# Patient Record
Sex: Female | Born: 1950
Health system: Southern US, Community
[De-identification: ages and names within clinical notes are randomized; demographics above are authoritative.]

## PROBLEM LIST (undated history)

## (undated) DIAGNOSIS — R42 Dizziness and giddiness: Secondary | ICD-10-CM

## (undated) DIAGNOSIS — F410 Panic disorder [episodic paroxysmal anxiety] without agoraphobia: Secondary | ICD-10-CM

## (undated) DIAGNOSIS — F99 Mental disorder, not otherwise specified: Secondary | ICD-10-CM

## (undated) DIAGNOSIS — M549 Dorsalgia, unspecified: Secondary | ICD-10-CM

## (undated) DIAGNOSIS — M67439 Ganglion, unspecified wrist: Secondary | ICD-10-CM

## (undated) DIAGNOSIS — J449 Chronic obstructive pulmonary disease, unspecified: Secondary | ICD-10-CM

## (undated) HISTORY — PX: ABDOMINAL HYSTERECTOMY: SHX81

## (undated) HISTORY — DX: Chronic obstructive pulmonary disease, unspecified: J44.9

## (undated) HISTORY — DX: Mental disorder, not otherwise specified: F99

## (undated) HISTORY — PX: TONSILLECTOMY: SUR1361

## (undated) HISTORY — PX: CYSTECTOMY: SUR359

---

## 1984-10-20 HISTORY — PX: NASAL SINUS SURGERY: SHX719

## 2001-02-26 ENCOUNTER — Emergency Department (HOSPITAL_COMMUNITY): Admission: EM | Admit: 2001-02-26 | Discharge: 2001-02-26 | Payer: Self-pay | Admitting: Emergency Medicine

## 2001-02-26 ENCOUNTER — Encounter: Payer: Self-pay | Admitting: Emergency Medicine

## 2001-03-02 ENCOUNTER — Encounter: Admission: RE | Admit: 2001-03-02 | Discharge: 2001-03-02 | Payer: Self-pay | Admitting: Family Medicine

## 2001-03-02 ENCOUNTER — Encounter: Payer: Self-pay | Admitting: Family Medicine

## 2001-09-30 ENCOUNTER — Emergency Department (HOSPITAL_COMMUNITY): Admission: EM | Admit: 2001-09-30 | Discharge: 2001-09-30 | Payer: Self-pay | Admitting: Emergency Medicine

## 2001-09-30 ENCOUNTER — Encounter: Payer: Self-pay | Admitting: Emergency Medicine

## 2003-02-18 ENCOUNTER — Emergency Department (HOSPITAL_COMMUNITY): Admission: EM | Admit: 2003-02-18 | Discharge: 2003-02-18 | Payer: Self-pay | Admitting: Emergency Medicine

## 2004-10-30 ENCOUNTER — Ambulatory Visit: Payer: Self-pay | Admitting: Family Medicine

## 2005-05-26 ENCOUNTER — Ambulatory Visit: Payer: Self-pay | Admitting: Family Medicine

## 2005-09-08 ENCOUNTER — Ambulatory Visit: Payer: Self-pay | Admitting: Family Medicine

## 2006-01-02 ENCOUNTER — Ambulatory Visit: Payer: Self-pay | Admitting: Family Medicine

## 2006-07-20 ENCOUNTER — Ambulatory Visit: Payer: Self-pay | Admitting: Family Medicine

## 2007-01-06 ENCOUNTER — Ambulatory Visit: Payer: Self-pay | Admitting: Family Medicine

## 2007-01-21 ENCOUNTER — Encounter: Payer: Self-pay | Admitting: Family Medicine

## 2007-01-21 ENCOUNTER — Ambulatory Visit: Payer: Self-pay | Admitting: Family Medicine

## 2007-01-21 DIAGNOSIS — F411 Generalized anxiety disorder: Secondary | ICD-10-CM | POA: Insufficient documentation

## 2007-01-21 LAB — CONVERTED CEMR LAB
ALT: 17 units/L (ref 0–40)
AST: 16 units/L (ref 0–37)
Albumin: 3.9 g/dL (ref 3.5–5.2)
Alkaline Phosphatase: 102 units/L (ref 39–117)
BUN: 10 mg/dL (ref 6–23)
Basophils Absolute: 0 10*3/uL (ref 0.0–0.1)
Basophils Relative: 0.7 % (ref 0.0–1.0)
Bilirubin, Direct: 0.1 mg/dL (ref 0.0–0.3)
CO2: 32 meq/L (ref 19–32)
Calcium: 9.7 mg/dL (ref 8.4–10.5)
Chloride: 108 meq/L (ref 96–112)
Cholesterol: 296 mg/dL (ref 0–200)
Creatinine, Ser: 0.7 mg/dL (ref 0.4–1.2)
Direct LDL: 152.2 mg/dL
Eosinophils Absolute: 0.2 10*3/uL (ref 0.0–0.6)
Eosinophils Relative: 3.3 % (ref 0.0–5.0)
GFR calc Af Amer: 111 mL/min
GFR calc non Af Amer: 92 mL/min
Glucose, Bld: 85 mg/dL (ref 70–99)
HCT: 39.7 % (ref 36.0–46.0)
HDL: 37.7 mg/dL — ABNORMAL LOW (ref >39.0)
Hemoglobin: 14 g/dL (ref 12.0–15.0)
Lymphocytes Relative: 37.7 % (ref 12.0–46.0)
MCHC: 35.1 g/dL (ref 30.0–36.0)
MCV: 86.1 fL (ref 78.0–100.0)
Monocytes Absolute: 0.5 10*3/uL (ref 0.2–0.7)
Monocytes Relative: 6.6 % (ref 3.0–11.0)
Neutro Abs: 3.7 10*3/uL (ref 1.4–7.7)
Neutrophils Relative %: 51.7 % (ref 43.0–77.0)
Platelets: 258 10*3/uL (ref 150–400)
Potassium: 3.3 meq/L — ABNORMAL LOW (ref 3.5–5.1)
RBC: 4.61 M/uL (ref 3.87–5.11)
RDW: 11.7 % (ref 11.5–14.6)
Sodium: 144 meq/L (ref 135–145)
TSH: 3.19 microintl units/mL (ref 0.35–5.50)
Total Bilirubin: 0.5 mg/dL (ref 0.3–1.2)
Total CHOL/HDL Ratio: 7.9
Total Protein: 7.3 g/dL (ref 6.0–8.3)
Triglycerides: 399 mg/dL (ref 0–149)
VLDL: 80 mg/dL — ABNORMAL HIGH (ref 0–40)
WBC: 7 10*3/uL (ref 4.5–10.5)

## 2007-08-09 ENCOUNTER — Telehealth: Payer: Self-pay | Admitting: Family Medicine

## 2008-01-03 ENCOUNTER — Telehealth: Payer: Self-pay | Admitting: Family Medicine

## 2008-02-07 ENCOUNTER — Telehealth: Payer: Self-pay | Admitting: Family Medicine

## 2010-08-03 ENCOUNTER — Emergency Department (HOSPITAL_COMMUNITY): Admission: EM | Admit: 2010-08-03 | Discharge: 2010-08-03 | Payer: Self-pay | Admitting: Emergency Medicine

## 2011-01-01 LAB — DIFFERENTIAL
Lymphocytes Relative: 15 % (ref 12–46)
Lymphs Abs: 1.6 10*3/uL (ref 0.7–4.0)
Neutrophils Relative %: 81 % — ABNORMAL HIGH (ref 43–77)

## 2011-01-01 LAB — CBC
Platelets: 224 10*3/uL (ref 150–400)
RBC: 4.67 MIL/uL (ref 3.87–5.11)
WBC: 11.4 10*3/uL — ABNORMAL HIGH (ref 4.0–10.5)

## 2011-01-01 LAB — URINALYSIS, ROUTINE W REFLEX MICROSCOPIC
Glucose, UA: NEGATIVE mg/dL
Protein, ur: NEGATIVE mg/dL
Specific Gravity, Urine: <1.005 — ABNORMAL LOW (ref 1.005–1.030)
Urobilinogen, UA: 0.2 mg/dL (ref 0.0–1.0)

## 2011-01-01 LAB — BASIC METABOLIC PANEL
Calcium: 9.9 mg/dL (ref 8.4–10.5)
Chloride: 106 mEq/L (ref 96–112)
Creatinine, Ser: 0.69 mg/dL (ref 0.4–1.2)
GFR calc Af Amer: >60 mL/min (ref >60)
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
## 2011-03-07 NOTE — Assessment & Plan Note (Signed)
[facility] OFFICE NOTE   Jessica [name], Jessica [name]                     MRN:          [id number]  DATE:01/21/2007                            DOB:          1951/01/17    This is a 60 year old woman here for a complete physical examination.  Generally she is doing reasonably well aside from her chronic neck pain  and her mild depression.  We have been treating her for these problems  for years as noted in the chart.  Now she also brings up a problem to me  for the first time of insomnia.  This has been especially difficult over  the past 6 months with both initial insomnia as well as staying asleep  through the night.  Otherwise, it has been many years since she has had  a complete physical or any sort of gynecological examination.  It has  been many years since she has had a mammogram.  She has never had a  colonoscopy.  Of note, she has had a hysterectomy.   For other details of her past medical history, family history, social  history, habits, etc., refer to our introductory note with her dated Mar 02, 2001.   ALLERGIES:  1. LEXAPRO.  2. PENICILLIN.  3. SULFA.  4. EFFEXOR.  5. KEFLEX.  6. TETRACYCLINE.   CURRENT MEDICATIONS:  1. Valium 5 mg b.i.d.  2. Vicodin 10/660 q.i.d.   OBJECTIVE:  Height 5 feet 1 inch.  Weight 152.  BP 122/84.  Pulse 84 and  regular.  GENERAL:  She is a little overweight.  SKIN:  Clear.  Eyes are clear.  She wears glasses.  Ears clear.  Pharynx clear.  NECK:  Supple without lymphadenopathy or masses.  LUNGS:  Clear.  CARDIAC:  Rate and rhythm regular without gallops, murmurs, rubs.  Distal pulses full.  EKG is within normal limits.  ABDOMEN:  Soft.  Normal bowel sounds.  Non-tender.  No masses.  BREASTS AND AXILLAE:  Clear.  PELVIC EXAM:  External genitalia within normal limits.  RECTAL EXAM:  No masses or tenderness.  Stool Hemoccult negative.  EXTREMITIES:  No clubbing,  cyanosis or edema.  NEUROLOGIC EXAM:  Grossly intact.   ASSESSMENT AND PLAN:  Problem:  1. Complete physical.  She is fasting, so we will send her to the lab      for the usual laboratories.  Once again, recommended colonoscopy,      and she once again declined.  She will set up her own mammogram      soon.  2. Chronic neck pain.  Stable.  3. Depression.  Stable.  4. Insomnia.  Ambien 10 mg nightly as needed, number 30 with 5      refills.     Tera Mater. Clent Ridges, MD  Electronically Signed    SAF/MedQ  DD: 01/21/2007  DT: 01/21/2007  Job #: [id number]

## 2013-12-27 ENCOUNTER — Observation Stay (HOSPITAL_COMMUNITY)
Admission: EM | Admit: 2013-12-27 | Discharge: 2013-12-28 | Disposition: A | Discharge status: Home / Self Care | Payer: Self-pay | Attending: Internal Medicine | Admitting: Internal Medicine

## 2013-12-27 ENCOUNTER — Encounter (HOSPITAL_COMMUNITY): Payer: Self-pay | Admitting: Emergency Medicine

## 2013-12-27 ENCOUNTER — Emergency Department (HOSPITAL_COMMUNITY): Payer: Self-pay

## 2013-12-27 DIAGNOSIS — R079 Chest pain, unspecified: Secondary | ICD-10-CM

## 2013-12-27 DIAGNOSIS — Z8659 Personal history of other mental and behavioral disorders: Secondary | ICD-10-CM | POA: Insufficient documentation

## 2013-12-27 DIAGNOSIS — R071 Chest pain on breathing: Principal | ICD-10-CM | POA: Insufficient documentation

## 2013-12-27 DIAGNOSIS — F411 Generalized anxiety disorder: Secondary | ICD-10-CM

## 2013-12-27 DIAGNOSIS — Z79899 Other long term (current) drug therapy: Secondary | ICD-10-CM | POA: Insufficient documentation

## 2013-12-27 DIAGNOSIS — Z881 Allergy status to other antibiotic agents status: Secondary | ICD-10-CM | POA: Insufficient documentation

## 2013-12-27 DIAGNOSIS — Z882 Allergy status to sulfonamides status: Secondary | ICD-10-CM | POA: Insufficient documentation

## 2013-12-27 DIAGNOSIS — F17200 Nicotine dependence, unspecified, uncomplicated: Secondary | ICD-10-CM | POA: Insufficient documentation

## 2013-12-27 DIAGNOSIS — R0602 Shortness of breath: Secondary | ICD-10-CM | POA: Insufficient documentation

## 2013-12-27 DIAGNOSIS — Z880 Allergy status to penicillin: Secondary | ICD-10-CM | POA: Insufficient documentation

## 2013-12-27 HISTORY — DX: Dorsalgia, unspecified: M54.9

## 2013-12-27 HISTORY — DX: Panic disorder (episodic paroxysmal anxiety): F41.0

## 2013-12-27 LAB — CBC
HCT: 42.9 % (ref 36.0–46.0)
HEMOGLOBIN: 15 g/dL (ref 12.0–15.0)
MCH: 30.1 pg (ref 26.0–34.0)
MCHC: 35 g/dL (ref 30.0–36.0)
MCV: 86.1 fL (ref 78.0–100.0)
Platelets: 256 10*3/uL (ref 150–400)
RBC: 4.98 MIL/uL (ref 3.87–5.11)
RDW: 12.5 % (ref 11.5–15.5)
WBC: 8.4 10*3/uL (ref 4.0–10.5)

## 2013-12-27 LAB — BASIC METABOLIC PANEL
BUN: 9 mg/dL (ref 6–23)
CALCIUM: 9.8 mg/dL (ref 8.4–10.5)
CO2: 25 mEq/L (ref 19–32)
Chloride: 101 mEq/L (ref 96–112)
Creatinine, Ser: 0.68 mg/dL (ref 0.50–1.10)
GLUCOSE: 97 mg/dL (ref 70–99)
POTASSIUM: 4.2 meq/L (ref 3.7–5.3)
SODIUM: 141 meq/L (ref 137–147)

## 2013-12-27 LAB — I-STAT TROPONIN, ED: Troponin i, poc: 0 ng/mL (ref 0.00–0.08)

## 2013-12-27 MED ORDER — DIAZEPAM 5 MG PO TABS
5.0000 mg | ORAL_TABLET | Freq: Four times a day (QID) | ORAL | Status: DC | PRN
  Administered 2013-12-27: 5 mg via ORAL
  Filled 2013-12-27: qty 1

## 2013-12-27 MED ORDER — HYDROCODONE-ACETAMINOPHEN 5-325 MG PO TABS
2.0000 | ORAL_TABLET | Freq: Once | ORAL | Status: AC
  Administered 2013-12-27: 2 via ORAL
  Filled 2013-12-27: qty 2

## 2013-12-27 MED ORDER — HYDROCODONE-ACETAMINOPHEN 10-325 MG PO TABS
1.0000 | ORAL_TABLET | Freq: Four times a day (QID) | ORAL | Status: DC | PRN
  Administered 2013-12-28 (×2): 1 via ORAL
  Filled 2013-12-27 (×2): qty 1

## 2013-12-27 MED ORDER — NITROGLYCERIN 0.4 MG SL SUBL: 0.4000 mg | SUBLINGUAL_TABLET | SUBLINGUAL | Status: DC | PRN

## 2013-12-27 MED ORDER — HEPARIN SODIUM (PORCINE) 5000 UNIT/ML IJ SOLN
5000.0000 [IU] | Freq: Three times a day (TID) | INTRAMUSCULAR | Status: DC
  Administered 2013-12-27 – 2013-12-28 (×2): 5000 [IU] via SUBCUTANEOUS
  Filled 2013-12-27 (×5): qty 1

## 2013-12-27 MED ORDER — SODIUM CHLORIDE 0.9 % IV SOLN: INTRAVENOUS | Status: DC

## 2013-12-27 MED ORDER — ASPIRIN EC 325 MG PO TBEC
325.0000 mg | DELAYED_RELEASE_TABLET | Freq: Every day | ORAL | Status: DC
  Administered 2013-12-28: 325 mg via ORAL
  Filled 2013-12-27: qty 1

## 2013-12-27 MED ORDER — SODIUM CHLORIDE 0.9 % IJ SOLN
3.0000 mL | Freq: Two times a day (BID) | INTRAMUSCULAR | Status: DC
  Administered 2013-12-28 (×2): 3 mL via INTRAVENOUS

## 2013-12-27 NOTE — ED Provider Notes (Signed)
Medical screening examination/treatment/procedure(s) were conducted as a shared visit with non-physician practitioner(s) and myself.  I personally evaluated the patient during the encounter.   EKG Interpretation   Date/Time:  Tuesday December 27 2013 20:40:21 EDT Ventricular Rate:  67 PR Interval:  155 QRS Duration: 96 QT Interval:  392 QTC Calculation: 414 R Axis:   37 Text Interpretation:  Age not entered, assumed to be  63 years old for  purpose of ECG interpretation Sinus rhythm No significant change since  last tracing EARLIER SAME DATE Confirmed by Bernette MayersSHELDON  MD, CHARLES [phone]([id number]  on 12/27/2013 8:49:20 PM      Pt with CP off and on for several weeks constant since earlier today. Risk factors of smoking. Questionable outpatient followup. Will plan admission for rule out.   Charles B. Bernette MayersSheldon, MD 12/27/13 2318

## 2013-12-27 NOTE — ED Provider Notes (Signed)
CSN: [id number]     Arrival date & time 12/27/13  1553 History   First MD Initiated Contact with Patient 12/27/13 1945     Chief Complaint  Patient presents with  . Chest Pain     (Consider location/radiation/quality/duration/timing/severity/associated sxs/prior Treatment) HPI Comments: Patient is a 63 year old female with a history of tobacco use, patient smokes 1.5-2 packs of cigarettes per day, who presents to the emergency department for chest pain. Patient states that chest pain has been intermittent over the last 2 weeks. She states that her chest pain has been constant since its morning. Pain is waxing and waning in severity and sharp in nature. Pain is currently rated 3/10, but was rated 8/10 at its worst today. Patient took an aspirin for her symptoms without relief. She denies any modifying factors of her symptoms. Patient denies associated fever, syncope or near syncope, cough or hemoptysis, nausea or vomiting, abdominal pain, numbness/tingling, and extremity weakness. Patient denies history of CAD, ACS, hypertension, hyperlipidemia, PE/DVT, and coagulopathies. Patient denies a family history of ACS. She says she has been under an increased amount of stress lately.  Patient is a 63 y.o. female presenting with chest pain. The history is provided by the patient. No language interpreter was used.  Chest Pain Associated symptoms: shortness of breath   Associated symptoms: no abdominal pain, no diaphoresis, no nausea, no numbness, not vomiting and no weakness     Past Medical History  Diagnosis Date  . Panic attack   . Back pain    Past Surgical History  Procedure Laterality Date  . Abdominal hysterectomy      partial  . Cystectomy     History reviewed. No pertinent family history. History  Substance Use Topics  . Smoking status: Current Every Day Smoker  . Smokeless tobacco: Not on file  . Alcohol Use: No   OB History   Grav Para Term Preterm Abortions TAB SAB Ect Mult  Living                  Review of Systems  Constitutional: Negative for chills and diaphoresis.  Respiratory: Positive for shortness of breath.   Cardiovascular: Positive for chest pain.  Gastrointestinal: Negative for nausea, vomiting and abdominal pain.  Neurological: Negative for syncope, weakness and numbness.  All other systems reviewed and are negative.     Allergies  Penicillins and Sulfa antibiotics  Home Medications   Current Outpatient Rx  Name  Route  Sig  Dispense  Refill  . diazepam (VALIUM) 5 MG tablet   Oral   Take 5 mg by mouth every 6 (six) hours as needed for anxiety.         Marland Kitchen HYDROcodone-acetaminophen (NORCO) 10-325 MG per tablet   Oral   Take 1 tablet by mouth every 6 (six) hours as needed.         Marland Kitchen ibuprofen (ADVIL,MOTRIN) 200 MG tablet   Oral   Take 200 mg by mouth every 6 (six) hours as needed.         . pseudoephedrine (SUDAFED) 30 MG tablet   Oral   Take 30 mg by mouth every 4 (four) hours as needed for congestion.          BP 105/66  Pulse 71  Temp(Src) 98.5 F (36.9 C) (Oral)  Resp 15  SpO2 96%  Physical Exam  Nursing note and vitals reviewed. Constitutional: She is oriented to person, place, and time. She appears well-developed and well-nourished. No distress.  HENT:  Head: Normocephalic and atraumatic.  Mouth/Throat: Oropharynx is clear and moist. No oropharyngeal exudate.  Eyes: Conjunctivae and EOM are normal. Pupils are equal, round, and reactive to light. No scleral icterus.  Neck: Normal range of motion.  Cardiovascular: Normal rate, regular rhythm, normal heart sounds and intact distal pulses.   No carotid bruits appreciated bilaterally  Pulmonary/Chest: Effort normal and breath sounds normal. No respiratory distress. She has no wheezes. She has no rales. She exhibits tenderness (Tenderness to palpation of the anterior left chest wall).  Chest expansion symmetric. No tachypnea, retractions, or accessory muscle use.   Abdominal: Soft. She exhibits no distension. There is no tenderness. There is no rebound and no guarding.  Musculoskeletal: Normal range of motion.  Neurological: She is alert and oriented to person, place, and time.  Skin: Skin is warm and dry. No rash noted. She is not diaphoretic. No erythema. No pallor.  Psychiatric: She has a normal mood and affect. Her behavior is normal.    ED Course  Procedures (including critical care time) Labs Review Labs Reviewed  CBC  BASIC METABOLIC PANEL  Rosezena SensorI-STAT TROPOININ, ED   Imaging Review Dg Chest 2 View  12/27/2013   CLINICAL DATA Chest pain and dizzy.  EXAM CHEST  2 VIEW  COMPARISON 08/03/2010  FINDINGS Chronic lucency in the upper lungs consistent with emphysema. Stable scarring in the left mid lung. Heart size is normal. No evidence for airspace disease. Bony thorax is intact.  IMPRESSION Chronic lung disease without acute findings.  SIGNATURE  Electronically Signed   By: Richarda OverlieAdam  Henn M.D.   On: 12/27/2013 17:37     EKG Interpretation   Date/Time:  Tuesday December 27 2013 20:40:21 EDT Ventricular Rate:  67 PR Interval:  155 QRS Duration: 96 QT Interval:  392 QTC Calculation: 414 R Axis:   37 Text Interpretation:  Age not entered, assumed to be  63 years old for  purpose of ECG interpretation Sinus rhythm No significant change since  last tracing EARLIER SAME DATE Confirmed by Bernette MayersSHELDON  MD, Leonette MostHARLES [phone]([id number])  on 12/27/2013 8:49:20 PM      MDM   Final diagnoses:  Chest pain    63 year old female with a history of tobacco use presents to the emergency department for chest pain. Chest pain has been ongoing over the last 2 weeks. Patient endorses it becoming more frequent recently. She specifically notes chest pain being constant since this morning. It has improved spontaneously since onset.  Physical exam today significant for chest pain reproducible on palpation of the left mid chest. Her cardiac workup today is negative with a negative  troponin and stable EKG. Chest x-ray shows no evidence of pneumothorax, pleural effusion, pneumonia, or mediastinal widening. Labs are unremarkable. My suspicion for ACS this patient is low given her reassuring workup. Patient also has a heart score of 3 consistent with low risk of MACE. Still, story is concerning given increasing frequency of chest pain and the fact that her pain has been lasting longer. Believe she warrants ACS rule out for these reasons. Have consulted with [facility] who will admit.   Filed Vitals:   12/27/13 1559 12/27/13 2028 12/27/13 2100  BP: 122/75 129/67 105/66  Pulse: 73 67 71  Temp: 98.5 F (36.9 C)    TempSrc: Oral    Resp: 18 19 15   SpO2: 97% 99% 96%       Antony MaduraKelly Aliyana Dlugosz, PA-C 12/27/13 2305

## 2013-12-27 NOTE — ED Notes (Addendum)
Pt presents with Left side chest pain x2 months, persistent Left side chest pain, SOB, and dizziness. Pt denies nausea, vomiting, diarrhea. Pt states she took ASA 325 mg

## 2013-12-27 NOTE — H&P (Addendum)
Hospitalist Admission History and Physical  Patient name: Jessica [name] Medical record number: [id number] Date of birth: 10/15/1951 Age: 63 y.o. Gender: female  Primary Care Provider: Pershing ProudJACKSON,SAMANTHA, [name]  Chief Complaint: chest pain  History of Present Illness:This is a 63 y.o. year old female with prior history of chronic back pain, tobacco abuse, presenting with chest pain x2 months. Patient reports intermittent episodes of anterior chest wall pain over the past 2 months. Patient states she shown clean up yesterday when she noticed some anterior chest wall pain after sweeping. Chest pain is described as anterior with 3 separate sharp pains do not radiate. No associated nausea, diaphoresis. Faint intermittent dizziness per patient. Patient states the pain persisted throughout last night. The pain did not wake her up from sleep. Patient states she took a full dose aspirin, Valium, Vicodin with minimal small improvement in pain. Denies any worse pain with deep breathing or cough. Pain does seem to be worse with movement. Patient presented to the ER today with chest pain. Troponins negative x1. EKG within normal limits. Labs otherwise within normal limits. Hemodynamically stable. Chest x-ray showed chronic lung disease with no acute findings.  Cardiovascular risk factors: Tobacco abuse, positive family history, age  Patient Active Problem List   Diagnosis Date Noted  . Chest pain 12/27/2013  . ANXIETY 01/21/2007   Past Medical History: Past Medical History  Diagnosis Date  . Panic attack   . Back pain     Past Surgical History: Past Surgical History  Procedure Laterality Date  . Abdominal hysterectomy      partial  . Cystectomy      Social History: History   Social History  . Marital Status: Single    Spouse Name: N/A    Number of Children: N/A  . Years of Education: N/A   Social History Main Topics  . Smoking status: Current Every Day Smoker  . Smokeless tobacco: None   . Alcohol Use: No  . Drug Use: No  . Sexual Activity: None   Other Topics Concern  . None   Social History Narrative  . None    Family History: History reviewed. No pertinent family history.  Allergies: Allergies  Allergen Reactions  . Penicillins   . Sulfa Antibiotics     Current Facility-Administered Medications  Medication Dose Route Frequency Provider Last Rate Last Dose  . 0.9 %  sodium chloride infusion   Intravenous Continuous Jessica [name]      . Melene Muller[START ON 12/28/2013] aspirin EC tablet 325 mg  325 mg Oral Daily Jessica [name]      . diazepam (VALIUM) tablet 5 mg  5 mg Oral Q6H PRN Jessica [name]      . heparin injection 5,000 Units  5,000 Units Subcutaneous 3 times per day Jessica [name]      . HYDROcodone-acetaminophen [facility](NORCO) 10-325 MG per tablet 1 tablet  1 tablet Oral Q6H PRN Jessica [name]      . sodium chloride 0.9 % injection 3 mL  3 mL Intravenous Q12H Jessica [name]       Current Outpatient Prescriptions  Medication Sig Dispense Refill  . diazepam (VALIUM) 5 MG tablet Take 5 mg by mouth every 6 (six) hours as needed for anxiety.      Marland Kitchen. HYDROcodone-acetaminophen (NORCO) 10-325 MG per tablet Take 1 tablet by mouth every 6 (six) hours as needed.      Marland Kitchen. ibuprofen (ADVIL,MOTRIN) 200 MG tablet Take 200 mg by mouth every  6 (six) hours as needed.      . pseudoephedrine (SUDAFED) 30 MG tablet Take 30 mg by mouth every 4 (four) hours as needed for congestion.       Review Of Systems: 12 point ROS negative except as noted above in HPI.  Physical Exam: Filed Vitals:   12/27/13 2100  BP: 105/66  Pulse: 71  Temp:   Resp: 15    General: alert and cooperative HEENT: PERRLA and extra ocular movement intact Heart: S1, S2 normal, no murmur, rub or gallop, regular rate and rhythm, positive anterior chest wall tenderness palpation mainly over left anterior chest. Positive pain over left anterior chest with resisted external rotation of the shoulders  bilaterally. Lungs: clear to auscultation and unlabored breathing Abdomen: abdomen is soft without significant tenderness, masses, organomegaly or guarding Extremities: extremities normal, atraumatic, no cyanosis or edema Skin:no rashes, no ecchymoses Neurology: normal without focal findings  Labs and Imaging: Lab Results  Component Value Date/Time   NA 141 12/27/2013  4:11 PM   K 4.2 12/27/2013  4:11 PM   CL 101 12/27/2013  4:11 PM   CO2 25 12/27/2013  4:11 PM   BUN 9 12/27/2013  4:11 PM   CREATININE 0.68 12/27/2013  4:11 PM   GLUCOSE 97 12/27/2013  4:11 PM   Lab Results  Component Value Date   WBC 8.4 12/27/2013   HGB 15.0 12/27/2013   HCT 42.9 12/27/2013   MCV 86.1 12/27/2013   PLT 256 12/27/2013     Dg Chest 2 View  12/27/2013   CLINICAL DATA Chest pain and dizzy.  EXAM CHEST  2 VIEW  COMPARISON 08/03/2010  FINDINGS Chronic lucency in the upper lungs consistent with emphysema. Stable scarring in the left mid lung. Heart size is normal. No evidence for airspace disease. Bony thorax is intact.  IMPRESSION Chronic lung disease without acute findings.  SIGNATURE  Electronically Signed   By: Jessica Overlie M.D.   On: 12/27/2013 17:37     Assessment and Plan: Jessica [name] is a 63 y.o. year old female presenting with chest pain  Chest pain: Highly atypical symptoms. Reproducible more to tenderness palpation across anterior chest. Suspect musculoskeletal source versus cardiac etiology which is less likely. Positive cardiovascular risk factors including age, tobacco abuse, family history. No tachycardia or hypoxia today. Wells score 0. We'll cycle cardiac enzymes. Risk stratification labs including hemoglobin A1c, TSH, lipid panel. May benefit from inpatient vs. outpt  stress test.? Cards consult in a.m. Defer to a.m. Team. Full dose aspirin. When necessary nitroglycerin. Tobacco abuse counseling.   Anxiety: Continue Valium Chronic back pain: Continue Vicodin. Should also help with  costochondritic component.  FEN/GI: heart healthy diet  Prophylaxis: sub q heparin  Disposition: pending further evaluation  Code Status:full code        Jessica Albee [name]  Pager: [phone]

## 2013-12-28 ENCOUNTER — Encounter (HOSPITAL_COMMUNITY): Payer: Self-pay

## 2013-12-28 DIAGNOSIS — R079 Chest pain, unspecified: Secondary | ICD-10-CM

## 2013-12-28 DIAGNOSIS — F411 Generalized anxiety disorder: Secondary | ICD-10-CM

## 2013-12-28 DIAGNOSIS — F17200 Nicotine dependence, unspecified, uncomplicated: Secondary | ICD-10-CM

## 2013-12-28 LAB — CBC WITH DIFFERENTIAL/PLATELET
BASOS ABS: 0.1 10*3/uL (ref 0.0–0.1)
Basophils Relative: 1 % (ref 0–1)
Eosinophils Absolute: 0.1 10*3/uL (ref 0.0–0.7)
Eosinophils Relative: 2 % (ref 0–5)
HCT: 37.8 % (ref 36.0–46.0)
Hemoglobin: 13.2 g/dL (ref 12.0–15.0)
LYMPHS ABS: 2.6 10*3/uL (ref 0.7–4.0)
LYMPHS PCT: 35 % (ref 12–46)
MCH: 30.3 pg (ref 26.0–34.0)
MCHC: 34.9 g/dL (ref 30.0–36.0)
MCV: 86.7 fL (ref 78.0–100.0)
Monocytes Absolute: 0.6 10*3/uL (ref 0.1–1.0)
Monocytes Relative: 7 % (ref 3–12)
NEUTROS PCT: 55 % (ref 43–77)
Neutro Abs: 4.1 10*3/uL (ref 1.7–7.7)
Platelets: 228 10*3/uL (ref 150–400)
RBC: 4.36 MIL/uL (ref 3.87–5.11)
RDW: 12.6 % (ref 11.5–15.5)
WBC: 7.4 10*3/uL (ref 4.0–10.5)

## 2013-12-28 LAB — COMPREHENSIVE METABOLIC PANEL
ALBUMIN: 3.4 g/dL — AB (ref 3.5–5.2)
ALK PHOS: 81 U/L (ref 39–117)
ALT: 7 U/L (ref 0–35)
AST: 13 U/L (ref 0–37)
BUN: 10 mg/dL (ref 6–23)
CHLORIDE: 104 meq/L (ref 96–112)
CO2: 26 mEq/L (ref 19–32)
CREATININE: 0.78 mg/dL (ref 0.50–1.10)
Calcium: 9.5 mg/dL (ref 8.4–10.5)
GFR calc non Af Amer: 87 mL/min — ABNORMAL LOW (ref >90)
GLUCOSE: 105 mg/dL — AB (ref 70–99)
POTASSIUM: 3.6 meq/L — AB (ref 3.7–5.3)
Sodium: 142 mEq/L (ref 137–147)
Total Bilirubin: 0.3 mg/dL (ref 0.3–1.2)
Total Protein: 6.3 g/dL (ref 6.0–8.3)

## 2013-12-28 LAB — LIPID PANEL
Cholesterol: 250 mg/dL — ABNORMAL HIGH (ref 0–200)
HDL: 36 mg/dL — AB (ref >39)
LDL Cholesterol: 172 mg/dL — ABNORMAL HIGH (ref 0–99)
Total CHOL/HDL Ratio: 6.9 RATIO
Triglycerides: 211 mg/dL — ABNORMAL HIGH (ref <150)
VLDL: 42 mg/dL — ABNORMAL HIGH (ref 0–40)

## 2013-12-28 LAB — TSH: TSH: 1.78 u[IU]/mL (ref 0.350–4.500)

## 2013-12-28 LAB — HEMOGLOBIN A1C
HEMOGLOBIN A1C: 5.5 % (ref <5.7)
MEAN PLASMA GLUCOSE: 111 mg/dL (ref <117)

## 2013-12-28 LAB — TROPONIN I

## 2013-12-28 NOTE — Progress Notes (Deleted)
Physician Discharge Summary  Jessica [name] Real WGN:[id number] DOB: 02/07/1951 DOA: 12/27/2013  PCP: Jessica ProudJACKSON,Jessica [name]  Admit date: 12/27/2013 Discharge date: 12/28/2013  Time spent: Less than 30 minutes  Recommendations for Outpatient Follow-up:  1. Jessica PurserSamantha Jackson, [name], in 3 days. May consider OP Cardiology consultation.  Discharge Diagnoses:  Active Problems:   Chest pain   Discharge Condition: Improved & Stable  Diet recommendation: Heart Healthy diet.  Filed Weights   12/28/13 0007  Weight: 60.464 kg (133 lb 4.8 oz)    History of present illness & hospital course:  63 year old female patient with history of chronic back pain, ongoing tobacco abuse, anxiety, presented to the ED on 12/27/13 with worsening chest pain. She reports intermittent episodes of anterior chest wall pain for the last 2 months. This seemed to get worse yesterday afternoon-located left upper parasternal region, describes it as sharp, worse on touching or with chest wall movements, nonpleuritic, nonradiating, not associated with dyspnea or cough, improved after pain medications. She denies trauma or lifting anything unusual. EKG without acute changes. Chest x-ray without acute changes. She was admitted to telemetry which did not show any arrhythmia alarms. Troponin was cycled x2 are negative. Physical exam shows chest pain reproducible on palpation of left upper parasternal region. This is highly suspicious for musculoskeletal etiology-possible costochondritis and less likely cardiac in origin. She is advised to continue taking her NSAIDs. She has been counseled extensively regarding tobacco cessation. Will defer management of dyslipidemia to outpatient followup. May consider outpatient cardiology consultation if further evaluation is deemed necessary.  Consultations:  None  Procedures:  None    Discharge Exam:  Complaints:  Chest pain is better but still reproducible to palpation.  Filed Vitals:    12/27/13 2300 12/28/13 0007 12/28/13 0609 12/28/13 1419  BP: 131/74 107/65 95/57 124/67  Pulse: 80 74 83 67  Temp:  97.9 F (36.6 C) 98.7 F (37.1 C) 97.2 F (36.2 C)  TempSrc:  Oral Oral Oral  Resp: 15     Height:  5\' 1"  (1.549 m)    Weight:  60.464 kg (133 lb 4.8 oz)    SpO2: 100% 100% 96% 96%    General exam: Middle-aged female lying comfortably in bed. Respiratory system: Clear. No increased work of breathing. Cardiovascular system: S1 & S2 heard, RRR. No JVD, murmurs, gallops, clicks or pedal edema. Telemetry: Sinus rhythm sinus bradycardia in the 50s. Gastrointestinal system: Abdomen is nondistended, soft and nontender. Normal bowel sounds heard. Central nervous system: Alert and oriented. No focal neurological deficits. Extremities: Symmetric 5 x 5 power.  Discharge Instructions      Discharge Orders   Future Orders Complete By Expires   Call MD for:  severe uncontrolled pain  As directed    Diet - low sodium heart healthy  As directed    Increase activity slowly  As directed        Medication List         diazepam 5 MG tablet  Commonly known as:  VALIUM  Take 5 mg by mouth every 6 (six) hours as needed for anxiety.     HYDROcodone-acetaminophen 10-325 MG per tablet  Commonly known as:  NORCO  Take 1 tablet by mouth every 6 (six) hours as needed.     ibuprofen 200 MG tablet  Commonly known as:  ADVIL,MOTRIN  Take 200 mg by mouth every 6 (six) hours as needed.     pseudoephedrine 30 MG tablet  Commonly known as:  SUDAFED  Take 30  mg by mouth every 4 (four) hours as needed for congestion.       Follow-up Information   Follow up with Jessica Purser, [name]. Schedule an appointment as soon as possible for a visit in 3 days.   Specialty:  Family Medicine   Contact information:   [address] [place] [address] [phone]        The results of significant diagnostics from this hospitalization (including imaging, microbiology, ancillary  and laboratory) are listed below for reference.    Significant Diagnostic Studies: Dg Chest 2 View  12/27/2013   CLINICAL DATA Chest pain and dizzy.  EXAM CHEST  2 VIEW  COMPARISON 08/03/2010  FINDINGS Chronic lucency in the upper lungs consistent with emphysema. Stable scarring in the left mid lung. Heart size is normal. No evidence for airspace disease. Bony thorax is intact.  IMPRESSION Chronic lung disease without acute findings.  SIGNATURE  Electronically Signed   By: Richarda Overlie M.D.   On: 12/27/2013 17:37    Microbiology: No results found for this or any previous visit (from the past 240 hour([name])).   Labs: Basic Metabolic Panel:  Recent Labs Lab 12/27/13 1611 12/28/13 0144  NA 141 142  K 4.2 3.6*  CL 101 104  CO2 25 26  GLUCOSE 97 105*  BUN 9 10  CREATININE 0.68 0.78  CALCIUM 9.8 9.5   Liver Function Tests:  Recent Labs Lab 12/28/13 0144  AST 13  ALT 7  ALKPHOS 81  BILITOT 0.3  PROT 6.3  ALBUMIN 3.4*   No results found for this basename: LIPASE, AMYLASE,  in the last 168 hours No results found for this basename: AMMONIA,  in the last 168 hours CBC:  Recent Labs Lab 12/27/13 1611 12/28/13 0144  WBC 8.4 7.4  NEUTROABS  --  4.1  HGB 15.0 13.2  HCT 42.9 37.8  MCV 86.1 86.7  PLT 256 228   Cardiac Enzymes:  Recent Labs Lab 12/28/13 0144 12/28/13 0725  TROPONINI <0.30 <0.30   BNP: BNP (last 3 results) No results found for this basename: PROBNP,  in the last 8760 hours CBG: No results found for this basename: GLUCAP,  in the last 168 hours  Additional labs: 1. Fasting lipids: Cholesterol 250, triglycerides 211, HDL 36, LDL 172 VLDL 42  2. Hemoglobin A1c: 5.5  3. TSH: 1.780    Signed:  Marcellus Scott, MD, FACP, FHM. [facility] Pager [phone]  If 7PM-7AM, please contact night-coverage [email or web address] Password [email or web address] 12/28/2013, 5:25 PM

## 2013-12-28 NOTE — Progress Notes (Signed)
UR completed 

## 2013-12-28 NOTE — Progress Notes (Signed)
DC orders received.  Patient stable with no S/S of distress.  Medication and discharge information reviewed with patient.  Patient DC home. Jessica [name]  

## 2013-12-28 NOTE — Discharge Summary (Signed)
Physician Discharge Summary  Drexel IhaFrances S Mckown GNF:[id number] DOB: 08/04/1951 DOA: 12/27/2013  PCP: Pershing ProudJACKSON,SAMANTHA, PA-C  Admit date: 12/27/2013 Discharge date: 12/28/2013  Time spent: Less than 30 minutes  Recommendations for Outpatient Follow-up:  1. Terie PurserSamantha Jackson, PA-C, in 3 days. May consider OP Cardiology consultation.  Discharge Diagnoses:  Active Problems:   Chest pain   Discharge Condition: Improved & Stable  Diet recommendation: Heart Healthy diet.  Filed Weights   12/28/13 0007  Weight: 60.464 kg (133 lb 4.8 oz)    History of present illness & hospital course:  63 year old female patient with history of chronic back pain, ongoing tobacco abuse, anxiety, presented to the ED on 12/27/13 with worsening chest pain. She reports intermittent episodes of anterior chest wall pain for the last 2 months. This seemed to get worse yesterday afternoon-located left upper parasternal region, describes it as sharp, worse on touching or with chest wall movements, nonpleuritic, nonradiating, not associated with dyspnea or cough, improved after pain medications. She denies trauma or lifting anything unusual. EKG without acute changes. Chest x-ray without acute changes. She was admitted to telemetry which did not show any arrhythmia alarms. Troponin was cycled x2 are negative. Physical exam shows chest pain reproducible on palpation of left upper parasternal region. This is highly suspicious for musculoskeletal etiology-possible costochondritis and less likely cardiac in origin. She is advised to continue taking her NSAIDs. She has been counseled extensively regarding tobacco cessation. Will defer management of dyslipidemia to outpatient followup. May consider outpatient cardiology consultation if further evaluation is deemed necessary.  Consultations:  None  Procedures:  None    Discharge Exam:  Complaints:  Chest pain is better but still reproducible to palpation.  Filed Vitals:    12/27/13 2300 12/28/13 0007 12/28/13 0609 12/28/13 1419  BP: 131/74 107/65 95/57 124/67  Pulse: 80 74 83 67  Temp:  97.9 F (36.6 C) 98.7 F (37.1 C) 97.2 F (36.2 C)  TempSrc:  Oral Oral Oral  Resp: 15     Height:  5\' 1"  (1.549 m)    Weight:  60.464 kg (133 lb 4.8 oz)    SpO2: 100% 100% 96% 96%    General exam: Middle-aged female lying comfortably in bed. Respiratory system: Clear. No increased work of breathing. Cardiovascular system: S1 & S2 heard, RRR. No JVD, murmurs, gallops, clicks or pedal edema. Telemetry: Sinus rhythm sinus bradycardia in the 50s. Gastrointestinal system: Abdomen is nondistended, soft and nontender. Normal bowel sounds heard. Central nervous system: Alert and oriented. No focal neurological deficits. Extremities: Symmetric 5 x 5 power.  Discharge Instructions      Discharge Orders   Future Orders Complete By Expires   Call MD for:  severe uncontrolled pain  As directed    Diet - low sodium heart healthy  As directed    Increase activity slowly  As directed        Medication List         diazepam 5 MG tablet  Commonly known as:  VALIUM  Take 5 mg by mouth every 6 (six) hours as needed for anxiety.     HYDROcodone-acetaminophen 10-325 MG per tablet  Commonly known as:  NORCO  Take 1 tablet by mouth every 6 (six) hours as needed.     ibuprofen 200 MG tablet  Commonly known as:  ADVIL,MOTRIN  Take 200 mg by mouth every 6 (six) hours as needed.     pseudoephedrine 30 MG tablet  Commonly known as:  SUDAFED  Take 30  mg by mouth every 4 (four) hours as needed for congestion.       Follow-up Information   Follow up with Terie Purser, PA-C. Schedule an appointment as soon as possible for a visit in 3 days.   Specialty:  Family Medicine   Contact information:   [address] [place] [address] [phone]        The results of significant diagnostics from this hospitalization (including imaging, microbiology, ancillary  and laboratory) are listed below for reference.    Significant Diagnostic Studies: Dg Chest 2 View  12/27/2013   CLINICAL DATA Chest pain and dizzy.  EXAM CHEST  2 VIEW  COMPARISON 08/03/2010  FINDINGS Chronic lucency in the upper lungs consistent with emphysema. Stable scarring in the left mid lung. Heart size is normal. No evidence for airspace disease. Bony thorax is intact.  IMPRESSION Chronic lung disease without acute findings.  SIGNATURE  Electronically Signed   By: Richarda Overlie M.D.   On: 12/27/2013 17:37    Microbiology: No results found for this or any previous visit (from the past 240 hour(s)).   Labs: Basic Metabolic Panel:  Recent Labs Lab 12/27/13 1611 12/28/13 0144  NA 141 142  K 4.2 3.6*  CL 101 104  CO2 25 26  GLUCOSE 97 105*  BUN 9 10  CREATININE 0.68 0.78  CALCIUM 9.8 9.5   Liver Function Tests:  Recent Labs Lab 12/28/13 0144  AST 13  ALT 7  ALKPHOS 81  BILITOT 0.3  PROT 6.3  ALBUMIN 3.4*   No results found for this basename: LIPASE, AMYLASE,  in the last 168 hours No results found for this basename: AMMONIA,  in the last 168 hours CBC:  Recent Labs Lab 12/27/13 1611 12/28/13 0144  WBC 8.4 7.4  NEUTROABS  --  4.1  HGB 15.0 13.2  HCT 42.9 37.8  MCV 86.1 86.7  PLT 256 228   Cardiac Enzymes:  Recent Labs Lab 12/28/13 0144 12/28/13 0725  TROPONINI <0.30 <0.30   BNP: BNP (last 3 results) No results found for this basename: PROBNP,  in the last 8760 hours CBG: No results found for this basename: GLUCAP,  in the last 168 hours  Additional labs: 1. Fasting lipids: Cholesterol 250, triglycerides 211, HDL 36, LDL 172 VLDL 42  2. Hemoglobin A1c: 5.5  3. TSH: 1.780    Signed:  Marcellus Scott, MD, FACP, FHM. [facility] Pager [phone]  If 7PM-7AM, please contact night-coverage [email or web address] Password [facility] 12/28/2013, 7:13 PM

## 2014-06-27 ENCOUNTER — Emergency Department (HOSPITAL_COMMUNITY)
Admission: EM | Admit: 2014-06-27 | Discharge: 2014-06-27 | Disposition: A | Discharge status: Home / Self Care | Payer: No Typology Code available for payment source | Attending: Emergency Medicine | Admitting: Emergency Medicine

## 2014-06-27 ENCOUNTER — Encounter (HOSPITAL_COMMUNITY): Payer: Self-pay | Admitting: Emergency Medicine

## 2014-06-27 DIAGNOSIS — F410 Panic disorder [episodic paroxysmal anxiety] without agoraphobia: Secondary | ICD-10-CM | POA: Insufficient documentation

## 2014-06-27 DIAGNOSIS — S42402A Unspecified fracture of lower end of left humerus, initial encounter for closed fracture: Secondary | ICD-10-CM

## 2014-06-27 DIAGNOSIS — Z791 Long term (current) use of non-steroidal anti-inflammatories (NSAID): Secondary | ICD-10-CM | POA: Diagnosis not present

## 2014-06-27 DIAGNOSIS — M542 Cervicalgia: Secondary | ICD-10-CM | POA: Diagnosis not present

## 2014-06-27 DIAGNOSIS — F17200 Nicotine dependence, unspecified, uncomplicated: Secondary | ICD-10-CM | POA: Diagnosis not present

## 2014-06-27 DIAGNOSIS — Z79899 Other long term (current) drug therapy: Secondary | ICD-10-CM | POA: Insufficient documentation

## 2014-06-27 DIAGNOSIS — Z880 Allergy status to penicillin: Secondary | ICD-10-CM | POA: Diagnosis not present

## 2014-06-27 DIAGNOSIS — S42309D Unspecified fracture of shaft of humerus, unspecified arm, subsequent encounter for fracture with routine healing: Secondary | ICD-10-CM | POA: Diagnosis present

## 2014-06-27 HISTORY — DX: Ganglion, unspecified wrist: M67.439

## 2014-06-27 HISTORY — DX: Dizziness and giddiness: R42
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
## 2014-06-27 NOTE — Discharge Instructions (Signed)
Cast or Splint Care °Casts and splints support injured limbs and keep bones from moving while they heal. It is important to care for your cast or splint at home.   °HOME CARE INSTRUCTIONS °· Keep the cast or splint uncovered during the drying period. It can take 24 to 48 hours to dry if it is made of plaster. A fiberglass cast will dry in less than 1 hour. °· Do not rest the cast on anything harder than a pillow for the first 24 hours. °· Do not put weight on your injured limb or apply pressure to the cast until your health care provider gives you permission. °· Keep the cast or splint dry. Wet casts or splints can lose their shape and may not support the limb as well. A wet cast that has lost its shape can also create harmful pressure on your skin when it dries. Also, wet skin can become infected. °¨ Cover the cast or splint with a plastic bag when bathing or when out in the rain or snow. If the cast is on the trunk of the body, take sponge baths until the cast is removed. °¨ If your cast does become wet, dry it with a towel or a blow dryer on the cool setting only. °· Keep your cast or splint clean. Soiled casts may be wiped with a moistened cloth. °· Do not place any hard or soft foreign objects under your cast or splint, such as cotton, toilet paper, lotion, or powder. °· Do not try to scratch the skin under the cast with any object. The object could get stuck inside the cast. Also, scratching could lead to an infection. If itching is a problem, use a blow dryer on a cool setting to relieve discomfort. °· Do not trim or cut your cast or remove padding from inside of it. °· Exercise all joints next to the injury that are not immobilized by the cast or splint. For example, if you have a long leg cast, exercise the hip joint and toes. If you have an arm cast or splint, exercise the shoulder, elbow, thumb, and fingers. °· Elevate your injured arm or leg on 1 or 2 pillows for the first 1 to 3 days to decrease  swelling and pain. It is best if you can comfortably elevate your cast so it is higher than your heart. °SEEK MEDICAL CARE IF:  °· Your cast or splint cracks. °· Your cast or splint is too tight or too loose. °· You have unbearable itching inside the cast. °· Your cast becomes wet or develops a soft spot or area. °· You have a bad smell coming from inside your cast. °· You get an object stuck under your cast. °· Your skin around the cast becomes red or raw. °· You have new pain or worsening pain after the cast has been applied. °SEEK IMMEDIATE MEDICAL CARE IF:  °· You have fluid leaking through the cast. °· You are unable to move your fingers or toes. °· You have discolored (blue or white), cool, painful, or very swollen fingers or toes beyond the cast. °· You have tingling or numbness around the injured area. °· You have severe pain or pressure under the cast. °· You have any difficulty with your breathing or have shortness of breath. °· You have chest pain. °Document Released: 10/03/2000 Document Revised: 07/27/2013 Document Reviewed: 04/14/2013 °ExitCare® Patient Information ©2015 [employer]. This information is not intended to replace advice given to you by your health care   provider. Make sure you discuss any questions you have with your health care provider. Or a Elbow Fracture, Simple A fracture is a break in one of the bones.When fractures are not displaced or separated, they may be treated with only a sling or splint. The sling or splint may only be required for two to three weeks. In these cases, often the elbow is put through early range of motion exercises to prevent the elbow from getting stiff. DIAGNOSIS  The diagnosis (learning what is wrong) of a fractured elbow is made by x-ray. These may be required before and after the elbow is put into a splint or cast. X-rays are taken after to make sure the bone pieces have not moved. HOME CARE INSTRUCTIONS   Only take over-the-counter or prescription  medicines for pain, discomfort, or fever as directed by your caregiver.  If you have a splint held on with an elastic wrap and your hand or fingers become numb or cold and blue, loosen the wrap and reapply more loosely. See your caregiver if there is no relief.  You may use ice for twenty minutes, four times per day, for the first two to three days.  Use your elbow as directed.  See your caregiver as directed. It is very important to keep all follow-up referrals and appointments in order to avoid any long-term problems with your elbow including chronic pain or stiffness. SEEK IMMEDIATE MEDICAL CARE IF:   There is swelling or increasing pain in elbow.  You begin to lose feeling or experience numbness or tingling in your hand or fingers.  You develop swelling of the hand and fingers.  You get a cold or blue hand or fingers on affected side. MAKE SURE YOU:   Understand these instructions.  Will watch your condition.  Will get help right away if you are not doing well or get worse. Document Released: 09/30/2001 Document Revised: 12/29/2011 Document Reviewed: 08/21/2009 [facility] Patient Information 2015 [place], [place]. This information is not intended to replace advice given to you by your health care provider. Make sure you discuss any questions you have with your health care provider.

## 2014-06-28 ENCOUNTER — Telehealth: Payer: Self-pay | Admitting: Orthopedic Surgery

## 2014-06-28 NOTE — ED Provider Notes (Signed)
Medical screening examination/treatment/procedure(s) were performed by non-physician practitioner and as supervising physician I was immediately available for consultation/collaboration.    Linwood Dibbles, MD 06/28/14 [phone]
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
## 2014-06-29 NOTE — Telephone Encounter (Signed)
Called back to patient to relay that Dr Romeo Apple has reviewed; advised that he will need initial Xray film and reports, as noted.  She states will be picking up from [facility] by Monday, and will call back to let us know; appointment pending.

## 2014-07-04 NOTE — Telephone Encounter (Signed)
Patient called back, states received films.  Reports being faxed.  Appointment scheduled

## 2014-07-05 NOTE — Telephone Encounter (Signed)
[facility] reports received.

## 2014-07-10 ENCOUNTER — Ambulatory Visit (INDEPENDENT_AMBULATORY_CARE_PROVIDER_SITE_OTHER): Payer: Commercial Managed Care - HMO | Admitting: Orthopedic Surgery

## 2014-07-10 ENCOUNTER — Other Ambulatory Visit (HOSPITAL_COMMUNITY): Payer: Self-pay | Admitting: Internal Medicine

## 2014-07-10 VITALS — BP 109/65 | Ht 61.0 in | Wt 126.0 lb

## 2014-07-10 DIAGNOSIS — N63 Unspecified lump in unspecified breast: Secondary | ICD-10-CM

## 2014-07-10 DIAGNOSIS — S52042A Displaced fracture of coronoid process of left ulna, initial encounter for closed fracture: Secondary | ICD-10-CM | POA: Insufficient documentation

## 2014-07-10 DIAGNOSIS — S52043A Displaced fracture of coronoid process of unspecified ulna, initial encounter for closed fracture: Secondary | ICD-10-CM
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
## 2014-07-10 NOTE — Patient Instructions (Signed)
Remove braces from the arm  Move as tolerated   Follow with chiropractor as needed   Keep appointment for mammogram

## 2014-07-18 ENCOUNTER — Ambulatory Visit (HOSPITAL_COMMUNITY)
Admission: RE | Admit: 2014-07-18 | Discharge: 2014-07-18 | Disposition: A | Discharge status: Home / Self Care | Payer: No Typology Code available for payment source | Source: Ambulatory Visit | Attending: Internal Medicine | Admitting: Internal Medicine

## 2014-07-18 ENCOUNTER — Other Ambulatory Visit (HOSPITAL_COMMUNITY): Payer: Self-pay | Admitting: Internal Medicine

## 2014-07-18 DIAGNOSIS — N63 Unspecified lump in unspecified breast: Secondary | ICD-10-CM

## 2014-07-26 ENCOUNTER — Ambulatory Visit (INDEPENDENT_AMBULATORY_CARE_PROVIDER_SITE_OTHER): Payer: Commercial Managed Care - HMO | Admitting: Neurology

## 2014-07-26 ENCOUNTER — Encounter: Payer: Self-pay | Admitting: Neurology

## 2014-07-26 VITALS — BP 100/64 | HR 74 | Resp 16 | Ht 61.25 in | Wt 130.0 lb

## 2014-07-26 DIAGNOSIS — R42 Dizziness and giddiness: Secondary | ICD-10-CM | POA: Insufficient documentation

## 2014-07-26 DIAGNOSIS — H9319 Tinnitus, unspecified ear: Secondary | ICD-10-CM | POA: Insufficient documentation

## 2014-07-26 DIAGNOSIS — H9313 Tinnitus, bilateral: Secondary | ICD-10-CM

## 2014-07-26 NOTE — Patient Instructions (Signed)
1. Schedule MRI brain with IACs with and without contrast  2. If MRI brain is normal, we will refer you to ENT specialist
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
## 2014-07-27 ENCOUNTER — Telehealth: Payer: Self-pay | Admitting: Neurology

## 2014-07-27 NOTE — Telephone Encounter (Signed)
Pt called to cancel her 10/27/13 f/u appt. She did not want to give a reason.

## 2014-08-08 ENCOUNTER — Ambulatory Visit (HOSPITAL_COMMUNITY): Payer: Commercial Managed Care - HMO

## 2014-08-08 ENCOUNTER — Ambulatory Visit (HOSPITAL_COMMUNITY): Payer: No Typology Code available for payment source

## 2014-09-07 ENCOUNTER — Ambulatory Visit (INDEPENDENT_AMBULATORY_CARE_PROVIDER_SITE_OTHER): Payer: Commercial Managed Care - HMO | Admitting: Otolaryngology

## 2014-09-07 DIAGNOSIS — R42 Dizziness and giddiness: Secondary | ICD-10-CM

## 2014-09-07 DIAGNOSIS — H61013 Acute perichondritis of external ear, bilateral: Secondary | ICD-10-CM

## 2014-09-11 ENCOUNTER — Encounter: Payer: Self-pay | Admitting: Orthopedic Surgery

## 2014-09-26 ENCOUNTER — Ambulatory Visit (INDEPENDENT_AMBULATORY_CARE_PROVIDER_SITE_OTHER): Payer: Commercial Managed Care - HMO | Admitting: Orthopedic Surgery

## 2014-09-26 VITALS — BP 100/62 | Ht 61.25 in | Wt 130.0 lb

## 2014-09-26 DIAGNOSIS — M545 Low back pain: Secondary | ICD-10-CM

## 2014-09-26 DIAGNOSIS — M47812 Spondylosis without myelopathy or radiculopathy, cervical region: Secondary | ICD-10-CM

## 2014-09-26 NOTE — Patient Instructions (Signed)
Call [facility] to arrange therapy

## 2014-09-27 ENCOUNTER — Encounter: Payer: Self-pay | Admitting: Orthopedic Surgery
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
## 2014-10-27 ENCOUNTER — Ambulatory Visit: Payer: Commercial Managed Care - HMO | Admitting: Neurology

## 2014-11-03 DIAGNOSIS — G894 Chronic pain syndrome: Secondary | ICD-10-CM | POA: Diagnosis not present

## 2014-11-03 DIAGNOSIS — Z6825 Body mass index (BMI) 25.0-25.9, adult: Secondary | ICD-10-CM | POA: Diagnosis not present

## 2014-11-03 DIAGNOSIS — F419 Anxiety disorder, unspecified: Secondary | ICD-10-CM | POA: Diagnosis not present

## 2014-12-05 DIAGNOSIS — E663 Overweight: Secondary | ICD-10-CM | POA: Diagnosis not present

## 2014-12-05 DIAGNOSIS — G894 Chronic pain syndrome: Secondary | ICD-10-CM | POA: Diagnosis not present

## 2014-12-05 DIAGNOSIS — J069 Acute upper respiratory infection, unspecified: Secondary | ICD-10-CM | POA: Diagnosis not present

## 2014-12-05 DIAGNOSIS — Z6825 Body mass index (BMI) 25.0-25.9, adult: Secondary | ICD-10-CM | POA: Diagnosis not present

## 2015-01-08 DIAGNOSIS — J0100 Acute maxillary sinusitis, unspecified: Secondary | ICD-10-CM | POA: Diagnosis not present

## 2015-01-08 DIAGNOSIS — J209 Acute bronchitis, unspecified: Secondary | ICD-10-CM | POA: Diagnosis not present

## 2015-01-08 DIAGNOSIS — Z6824 Body mass index (BMI) 24.0-24.9, adult: Secondary | ICD-10-CM | POA: Diagnosis not present

## 2015-01-08 DIAGNOSIS — F419 Anxiety disorder, unspecified: Secondary | ICD-10-CM | POA: Diagnosis not present

## 2015-01-08 DIAGNOSIS — J449 Chronic obstructive pulmonary disease, unspecified: Secondary | ICD-10-CM | POA: Diagnosis not present

## 2015-02-28 DIAGNOSIS — F419 Anxiety disorder, unspecified: Secondary | ICD-10-CM | POA: Diagnosis not present

## 2015-02-28 DIAGNOSIS — J449 Chronic obstructive pulmonary disease, unspecified: Secondary | ICD-10-CM | POA: Diagnosis not present

## 2015-02-28 DIAGNOSIS — G894 Chronic pain syndrome: Secondary | ICD-10-CM | POA: Diagnosis not present

## 2015-02-28 DIAGNOSIS — Z6824 Body mass index (BMI) 24.0-24.9, adult: Secondary | ICD-10-CM | POA: Diagnosis not present

## 2015-05-21 DIAGNOSIS — G894 Chronic pain syndrome: Secondary | ICD-10-CM | POA: Diagnosis not present

## 2015-05-21 DIAGNOSIS — Z6824 Body mass index (BMI) 24.0-24.9, adult: Secondary | ICD-10-CM | POA: Diagnosis not present

## 2015-05-21 DIAGNOSIS — F419 Anxiety disorder, unspecified: Secondary | ICD-10-CM | POA: Diagnosis not present

## 2015-05-21 DIAGNOSIS — F330 Major depressive disorder, recurrent, mild: Secondary | ICD-10-CM | POA: Diagnosis not present

## 2015-05-21 DIAGNOSIS — Z1389 Encounter for screening for other disorder: Secondary | ICD-10-CM | POA: Diagnosis not present

## 2015-06-29 DIAGNOSIS — Z1389 Encounter for screening for other disorder: Secondary | ICD-10-CM | POA: Diagnosis not present

## 2015-06-29 DIAGNOSIS — Z6825 Body mass index (BMI) 25.0-25.9, adult: Secondary | ICD-10-CM | POA: Diagnosis not present

## 2015-06-29 DIAGNOSIS — E663 Overweight: Secondary | ICD-10-CM | POA: Diagnosis not present

## 2015-06-29 DIAGNOSIS — J069 Acute upper respiratory infection, unspecified: Secondary | ICD-10-CM | POA: Diagnosis not present

## 2015-07-06 DIAGNOSIS — H5210 Myopia, unspecified eye: Secondary | ICD-10-CM | POA: Diagnosis not present

## 2015-07-06 DIAGNOSIS — H524 Presbyopia: Secondary | ICD-10-CM | POA: Diagnosis not present

## 2015-07-09 DIAGNOSIS — Z0100 Encounter for examination of eyes and vision without abnormal findings: Secondary | ICD-10-CM | POA: Diagnosis not present

## 2015-08-03 DIAGNOSIS — E663 Overweight: Secondary | ICD-10-CM | POA: Diagnosis not present

## 2015-08-03 DIAGNOSIS — Z1389 Encounter for screening for other disorder: Secondary | ICD-10-CM | POA: Diagnosis not present

## 2015-08-03 DIAGNOSIS — R05 Cough: Secondary | ICD-10-CM | POA: Diagnosis not present

## 2015-08-03 DIAGNOSIS — R062 Wheezing: Secondary | ICD-10-CM | POA: Diagnosis not present

## 2015-08-03 DIAGNOSIS — Z6825 Body mass index (BMI) 25.0-25.9, adult: Secondary | ICD-10-CM | POA: Diagnosis not present

## 2015-08-08 ENCOUNTER — Emergency Department (HOSPITAL_COMMUNITY)
Admission: EM | Admit: 2015-08-08 | Discharge: 2015-08-08 | Disposition: A | Discharge status: Home / Self Care | Payer: Commercial Managed Care - HMO | Attending: Emergency Medicine | Admitting: Emergency Medicine

## 2015-08-08 ENCOUNTER — Emergency Department (HOSPITAL_COMMUNITY): Payer: Commercial Managed Care - HMO

## 2015-08-08 ENCOUNTER — Encounter (HOSPITAL_COMMUNITY): Payer: Self-pay | Admitting: Emergency Medicine

## 2015-08-08 DIAGNOSIS — R05 Cough: Secondary | ICD-10-CM | POA: Diagnosis not present

## 2015-08-08 DIAGNOSIS — Z880 Allergy status to penicillin: Secondary | ICD-10-CM | POA: Diagnosis not present

## 2015-08-08 DIAGNOSIS — R911 Solitary pulmonary nodule: Secondary | ICD-10-CM | POA: Insufficient documentation

## 2015-08-08 DIAGNOSIS — J441 Chronic obstructive pulmonary disease with (acute) exacerbation: Secondary | ICD-10-CM | POA: Diagnosis not present

## 2015-08-08 DIAGNOSIS — Z79899 Other long term (current) drug therapy: Secondary | ICD-10-CM | POA: Insufficient documentation

## 2015-08-08 DIAGNOSIS — R059 Cough, unspecified: Secondary | ICD-10-CM

## 2015-08-08 DIAGNOSIS — F410 Panic disorder [episodic paroxysmal anxiety] without agoraphobia: Secondary | ICD-10-CM | POA: Diagnosis not present

## 2015-08-08 DIAGNOSIS — Z8739 Personal history of other diseases of the musculoskeletal system and connective tissue: Secondary | ICD-10-CM | POA: Insufficient documentation

## 2015-08-08 DIAGNOSIS — J40 Bronchitis, not specified as acute or chronic: Secondary | ICD-10-CM

## 2015-08-08 DIAGNOSIS — R0602 Shortness of breath: Secondary | ICD-10-CM | POA: Diagnosis not present

## 2015-08-08 DIAGNOSIS — Z720 Tobacco use: Secondary | ICD-10-CM | POA: Insufficient documentation

## 2015-08-08 LAB — COMPREHENSIVE METABOLIC PANEL
ALBUMIN: 4.6 g/dL (ref 3.5–5.0)
ALT: 13 U/L — ABNORMAL LOW (ref 14–54)
ANION GAP: 10 (ref 5–15)
AST: 20 U/L (ref 15–41)
Alkaline Phosphatase: 80 U/L (ref 38–126)
BUN: 12 mg/dL (ref 6–20)
CALCIUM: 9.8 mg/dL (ref 8.9–10.3)
CO2: 27 mmol/L (ref 22–32)
Chloride: 103 mmol/L (ref 101–111)
Creatinine, Ser: 0.83 mg/dL (ref 0.44–1.00)
GFR calc non Af Amer: >60 mL/min (ref >60)
Glucose, Bld: 119 mg/dL — ABNORMAL HIGH (ref 65–99)
Potassium: 3.7 mmol/L (ref 3.5–5.1)
SODIUM: 140 mmol/L (ref 135–145)
TOTAL PROTEIN: 7.9 g/dL (ref 6.5–8.1)
Total Bilirubin: 0.8 mg/dL (ref 0.3–1.2)

## 2015-08-08 LAB — CBC
HCT: 44.6 % (ref 36.0–46.0)
HEMOGLOBIN: 15.3 g/dL — AB (ref 12.0–15.0)
MCH: 30.4 pg (ref 26.0–34.0)
MCHC: 34.3 g/dL (ref 30.0–36.0)
MCV: 88.5 fL (ref 78.0–100.0)
PLATELETS: 282 10*3/uL (ref 150–400)
RBC: 5.04 MIL/uL (ref 3.87–5.11)
RDW: 13 % (ref 11.5–15.5)
WBC: 13.6 10*3/uL — ABNORMAL HIGH (ref 4.0–10.5)

## 2015-08-08 MED ORDER — PREDNISONE 10 MG PO TABS: 40.0000 mg | ORAL_TABLET | Freq: Every day | ORAL | Status: AC

## 2015-08-08 MED ORDER — PREDNISONE 50 MG PO TABS
60.0000 mg | ORAL_TABLET | Freq: Once | ORAL | Status: AC
  Administered 2015-08-08: 60 mg via ORAL
  Filled 2015-08-08 (×2): qty 1

## 2015-08-08 MED ORDER — BENZONATATE 100 MG PO CAPS: 100.0000 mg | ORAL_CAPSULE | Freq: Three times a day (TID) | ORAL | Status: DC | PRN

## 2015-08-08 MED ORDER — AZITHROMYCIN 250 MG PO TABS: 250.0000 mg | ORAL_TABLET | Freq: Every day | ORAL | Status: AC

## 2015-08-08 NOTE — ED Notes (Signed)
Pt state that she has been having a cough for about 3 weeks- seen by primary MD - given ABT's for bronchitis -- but now cough is getting worst . Finished all the meds today - but no improvement

## 2015-08-08 NOTE — ED Provider Notes (Signed)
CSN: [id number]     Arrival date & time 08/08/15  1713 History   First MD Initiated Contact with Patient 08/08/15 1908     Chief Complaint  Patient presents with  . Cough     (Consider location/radiation/quality/duration/timing/severity/associated sxs/prior Treatment) Patient is a 64 y.o. female presenting with cough.  Cough Cough characteristics:  Productive Sputum characteristics:  Yellow Severity:  Severe Onset quality:  Gradual Duration:  3 weeks Timing:  Constant Progression:  Worsening Chronicity:  New Smoker: yes   Context: sick contacts (multiple)   Relieved by: flonase, breath right strip. Ineffective treatments: prednisone, zpak, did not help, cough med with codeine, inhaler. Associated symptoms: chest pain (with cough 1week, only with coughing, low grade), headaches, rhinorrhea, shortness of breath, sinus congestion and wheezing   Associated symptoms: no fever (low grade), no rash and no sore throat     Past Medical History  Diagnosis Date  . Panic attack   . Back pain   . Vertigo   . Ganglion cyst of wrist    Past Surgical History  Procedure Laterality Date  . Abdominal hysterectomy      partial  . Cystectomy    . Tonsillectomy    . Nasal sinus surgery  1986   History reviewed. No pertinent family history. Social History  Substance Use Topics  . Smoking status: Current Every Day Smoker -- 1.00 packs/day    Types: Cigarettes  . Smokeless tobacco: None  . Alcohol Use: No   OB History    No data available     Review of Systems  Constitutional: Negative for fever (low grade).  HENT: Positive for rhinorrhea. Negative for sore throat.   Eyes: Negative for visual disturbance.  Respiratory: Positive for cough, shortness of breath and wheezing.   Cardiovascular: Positive for chest pain (with cough 1week, only with coughing, low grade).  Gastrointestinal: Negative for nausea, vomiting, abdominal pain and diarrhea.  Genitourinary: Negative for  difficulty urinating.  Musculoskeletal: Negative for back pain and neck pain.  Skin: Negative for rash.  Neurological: Positive for headaches. Negative for syncope.      Allergies  Bee venom; Penicillins; Sulfa antibiotics; and Tetracyclines & related  Home Medications   Prior to Admission medications   Medication Sig Start Date End Date Taking? Authorizing Provider  albuterol (PROVENTIL) (2.5 MG/3ML) 0.083% nebulizer solution Take 2.5 mg by nebulization as needed for wheezing or shortness of breath.   Yes Historical Provider, MD  diazepam (VALIUM) 5 MG tablet Take 5 mg by mouth every 6 (six) hours as needed for anxiety.   Yes Historical Provider, MD  HYDROcodone-acetaminophen (NORCO) 10-325 MG per tablet Take 1 tablet by mouth every 6 (six) hours as needed for moderate pain or severe pain.    Yes Historical Provider, MD  azithromycin (ZITHROMAX Z-PAK) 250 MG tablet Take 1 tablet (250 mg total) by mouth daily. Take 2 tablets on the first day followed by 1 tablet per day for 4 days. 08/08/15 08/12/15  Alvira Monday, MD  benzonatate (TESSALON) 100 MG capsule Take 1 capsule (100 mg total) by mouth 3 (three) times daily as needed for cough. 08/08/15   Alvira Monday, MD  predniSONE (DELTASONE) 10 MG tablet Take 4 tablets (40 mg total) by mouth daily. 08/08/15 08/11/15  Alvira Monday, MD   BP 114/67 mmHg  Pulse 63  Temp(Src) 98.1 F (36.7 C) (Oral)  Resp 17  Ht  (1.549 m)  Wt 135 lb (61.236 kg)  BMI 25.52 kg/m2  SpO2  96% Physical Exam  Constitutional: She is oriented to person, place, and time. She appears well-developed and well-nourished. No distress.  HENT:  Head: Normocephalic and atraumatic.  Eyes: Conjunctivae and EOM are normal.  Neck: Normal range of motion.  Cardiovascular: Normal rate, regular rhythm, normal heart sounds and intact distal pulses.  Exam reveals no gallop and no friction rub.   No murmur heard. Pulmonary/Chest: Effort normal and breath sounds  normal. No respiratory distress. She has no wheezes. She has no rales.  Abdominal: Soft. She exhibits no distension. There is no tenderness. There is no guarding.  Musculoskeletal: She exhibits no edema or tenderness.  Neurological: She is alert and oriented to person, place, and time.  Skin: Skin is warm and dry. No rash noted. She is not diaphoretic. No erythema.  Nursing note and vitals reviewed.   ED Course  Procedures (including critical care time) Labs Review Labs Reviewed  CBC - Abnormal; Notable for the following:    WBC 13.6 (*)    Hemoglobin 15.3 (*)    All other components within normal limits  COMPREHENSIVE METABOLIC PANEL - Abnormal; Notable for the following:    Glucose, Bld 119 (*)    ALT 13 (*)    All other components within normal limits    Imaging Review Dg Chest 2 View  08/08/2015  CLINICAL DATA:  Productive cough for 3 weeks, cough getting worse, congestion EXAM: CHEST  2 VIEW COMPARISON:  12/27/2013 FINDINGS: Normal heart size, mediastinal contours and pulmonary vascularity. Atherosclerotic calcification aorta. Emphysematous and minimal chronic bronchitic changes consistent with COPD. Minimal chronic interstitial prominence. Linear scarring in lingula. No acute infiltrate, pleural effusion or pneumothorax. 9 mm questionable nodular density LEFT base not seen on previous exam. Bones demineralized. IMPRESSION: COPD changes with lingular scarring. Questionable 9 mm nodular density LEFT base versus summation artifact; CT chest recommended to exclude pulmonary nodule. Electronically Signed   By: Ulyses Southward M.D.   On: 08/08/2015 18:08   Ct Chest Wo Contrast  08/08/2015  CLINICAL DATA:  Cough for 3 weeks. Questionable nodule at left lung base on radiograph. EXAM: CT CHEST WITHOUT CONTRAST TECHNIQUE: Multidetector CT imaging of the chest was performed following the standard protocol without IV contrast. COMPARISON:  Radiograph earlier this day. FINDINGS: Mediastinum/Nodes:  The heart is normal in size. Minimal coronary artery calcifications are seen. Thoracic aorta is normal in course in caliber with moderate atherosclerosis there is a conventional branching pattern from the aortic arch. No mediastinal or evidence of hilar adenopathy. No pericardial effusion. Lungs/Pleura: Mild apical predominant emphysema. There is a calcified pleural-based nodule in the right upper lobe. There is a 4 mm subpleural nodular opacity anteriorly in the right middle lobe, image number 45. No nodule in the left lung to account for abnormality on radiograph. There scattered linear subsegmental atelectasis in both lower lobes and lingula. Minimal fissural thickening involving the interlobar fissure on the left. Mild lower lobe bronchial thickening. No consolidation to suggest pneumonia. No findings of pulmonary edema. Upper abdomen: No acute abnormality. Small hepatic granuloma is seen adjacent to the gallbladder fossa. Musculoskeletal: There are no acute or suspicious osseous abnormalities. Scattered bone islands within T10 and T12. IMPRESSION: 1. No nodule the left lung base to account for abnormality on radiograph. This is likely a summation shadow. 2. There is a 4 mm subpleural nodule in the right middle lobe. While this may reflect subpleural lymph node or atelectasis, given smoking history, would follow-up. For a nodule of this size, follow-up  chest CT at 1 year is recommended. This recommendation follows the consensus statement: Guidelines for Management of Small Pulmonary Nodules Detected on CT Scans: A Statement from the Fleischner Society as published in Radiology 2005; 237:395-400. 3. Mild lower lobe bronchial thickening. Scattered linear atelectasis. No pneumonia. 4. Minimal coronary artery calcifications, mild atherosclerosis of the thoracic aorta. Electronically Signed   By: Rubye OaksMelanie  Ehinger M.D.   On: 08/08/2015 19:19   I have personally reviewed and evaluated these images and lab results as  part of my medical decision-making.   EKG Interpretation   Date/Time:  Wednesday August 08 2015 21:17:52 EDT Ventricular Rate:  53 PR Interval:  130 QRS Duration: 92 QT Interval:  399 QTC Calculation: 375 R Axis:   49 Text Interpretation:  Sinus rhythm RSR' in V1 or V2, right VCD or RVH No  significant change since last tracing Confirmed by [facility] MD, Alik Mawson  ([id number]) on 08/08/2015 9:21:08 PM      MDM   Final diagnoses:  Bronchitis  COPD exacerbation (HCC)  Cough  Pulmonary nodule   64yo female with history of smoking presents with concern for 3 weeks of productive cough, nasal congestion, and dyspnea.  Differential diagnosis for dyspnea includes ACS, PE, COPD exacerbation, CHF exacerbation, anemia, pneumonia.  Chest x-ray was done which showed likely COPD changes with question of nodule. CT ordered during triage and showed this area was not nodule, however showed other area concerning for nodule, no sign of pneumonia, and some bronchial thickening/findings likely consistent with COPD. EKG was evaluated by me which showed no significant changes from prior.  No sign of CHF exacerbation by hx, physical or imaging.  Patient is low risk wells and in presence of multiple sick contacts, nasal congestion, cough have low suspicion for PE.  Pt with likely COPD, and given increased cough with sputum will treat as COPD exacerbation with azithromycin and prednisone. Gave tessalon perles for cough. Discussed need for PCP follow up and importance of smoking cessation.  Recommend follow up CT in 12mos for nodule. Patient discharged in stable condition with understanding of reasons to return.    Alvira MondayErin Naman Spychalski, MD 08/09/15 [phone]

## 2015-08-15 DIAGNOSIS — Z1389 Encounter for screening for other disorder: Secondary | ICD-10-CM | POA: Diagnosis not present

## 2015-08-15 DIAGNOSIS — J040 Acute laryngitis: Secondary | ICD-10-CM | POA: Diagnosis not present

## 2015-08-15 DIAGNOSIS — G894 Chronic pain syndrome: Secondary | ICD-10-CM | POA: Diagnosis not present

## 2015-08-15 DIAGNOSIS — Z6824 Body mass index (BMI) 24.0-24.9, adult: Secondary | ICD-10-CM | POA: Diagnosis not present

## 2015-08-15 DIAGNOSIS — F419 Anxiety disorder, unspecified: Secondary | ICD-10-CM | POA: Diagnosis not present

## 2015-08-26 ENCOUNTER — Emergency Department (HOSPITAL_COMMUNITY)
Admission: EM | Admit: 2015-08-26 | Discharge: 2015-08-27 | Disposition: A | Discharge status: Home / Self Care | Payer: Commercial Managed Care - HMO | Attending: Emergency Medicine | Admitting: Emergency Medicine

## 2015-08-26 ENCOUNTER — Encounter (HOSPITAL_COMMUNITY): Payer: Self-pay | Admitting: *Deleted

## 2015-08-26 ENCOUNTER — Emergency Department (HOSPITAL_COMMUNITY): Payer: Commercial Managed Care - HMO

## 2015-08-26 DIAGNOSIS — F410 Panic disorder [episodic paroxysmal anxiety] without agoraphobia: Secondary | ICD-10-CM | POA: Diagnosis not present

## 2015-08-26 DIAGNOSIS — J069 Acute upper respiratory infection, unspecified: Secondary | ICD-10-CM | POA: Insufficient documentation

## 2015-08-26 DIAGNOSIS — Z720 Tobacco use: Secondary | ICD-10-CM | POA: Diagnosis not present

## 2015-08-26 DIAGNOSIS — F17210 Nicotine dependence, cigarettes, uncomplicated: Secondary | ICD-10-CM | POA: Diagnosis not present

## 2015-08-26 DIAGNOSIS — Z7951 Long term (current) use of inhaled steroids: Secondary | ICD-10-CM | POA: Diagnosis not present

## 2015-08-26 DIAGNOSIS — Z8739 Personal history of other diseases of the musculoskeletal system and connective tissue: Secondary | ICD-10-CM | POA: Insufficient documentation

## 2015-08-26 DIAGNOSIS — Z79899 Other long term (current) drug therapy: Secondary | ICD-10-CM | POA: Insufficient documentation

## 2015-08-26 DIAGNOSIS — R0602 Shortness of breath: Secondary | ICD-10-CM | POA: Diagnosis not present

## 2015-08-26 DIAGNOSIS — J439 Emphysema, unspecified: Secondary | ICD-10-CM | POA: Insufficient documentation

## 2015-08-26 DIAGNOSIS — H5703 Miosis: Secondary | ICD-10-CM | POA: Diagnosis not present

## 2015-08-26 DIAGNOSIS — R05 Cough: Secondary | ICD-10-CM | POA: Diagnosis not present

## 2015-08-26 DIAGNOSIS — T402X5A Adverse effect of other opioids, initial encounter: Secondary | ICD-10-CM | POA: Diagnosis not present

## 2015-08-26 DIAGNOSIS — Z880 Allergy status to penicillin: Secondary | ICD-10-CM | POA: Diagnosis not present

## 2015-08-26 DIAGNOSIS — J399 Disease of upper respiratory tract, unspecified: Secondary | ICD-10-CM | POA: Diagnosis not present

## 2015-08-26 LAB — URINALYSIS, ROUTINE W REFLEX MICROSCOPIC
BILIRUBIN URINE: NEGATIVE
Glucose, UA: NEGATIVE mg/dL
KETONES UR: NEGATIVE mg/dL
LEUKOCYTES UA: NEGATIVE
Nitrite: NEGATIVE
PH: 7 (ref 5.0–8.0)
PROTEIN: NEGATIVE mg/dL
Specific Gravity, Urine: 1.018 (ref 1.005–1.030)
Urobilinogen, UA: 1 mg/dL (ref 0.0–1.0)

## 2015-08-26 LAB — URINE MICROSCOPIC-ADD ON

## 2015-08-26 LAB — I-STAT CHEM 8, ED
BUN: 20 mg/dL (ref 6–20)
CREATININE: 0.8 mg/dL (ref 0.44–1.00)
Calcium, Ion: 1.09 mmol/L — ABNORMAL LOW (ref 1.13–1.30)
Chloride: 102 mmol/L (ref 101–111)
Glucose, Bld: 111 mg/dL — ABNORMAL HIGH (ref 65–99)
HEMATOCRIT: 39 % (ref 36.0–46.0)
HEMOGLOBIN: 13.3 g/dL (ref 12.0–15.0)
POTASSIUM: 3.5 mmol/L (ref 3.5–5.1)
SODIUM: 137 mmol/L (ref 135–145)
TCO2: 22 mmol/L (ref 0–100)

## 2015-08-26 MED ORDER — ALBUTEROL SULFATE HFA 108 (90 BASE) MCG/ACT IN AERS: 2.0000 | INHALATION_SPRAY | Freq: Once | RESPIRATORY_TRACT | Status: DC

## 2015-08-26 MED ORDER — NALOXONE HCL 0.4 MG/ML IJ SOLN
0.4000 mg | Freq: Once | INTRAMUSCULAR | Status: AC
  Administered 2015-08-26: 0.4 mg via INTRAMUSCULAR
  Filled 2015-08-26: qty 1

## 2015-08-26 MED ORDER — ALBUTEROL SULFATE HFA 108 (90 BASE) MCG/ACT IN AERS
6.0000 | INHALATION_SPRAY | Freq: Once | RESPIRATORY_TRACT | Status: AC
  Administered 2015-08-26: 6 via RESPIRATORY_TRACT
  Filled 2015-08-26: qty 6.7

## 2015-08-26 MED ORDER — SODIUM CHLORIDE 0.9 % IV BOLUS (SEPSIS)
1000.0000 mL | Freq: Once | INTRAVENOUS | Status: AC
  Administered 2015-08-26: 1000 mL via INTRAVENOUS

## 2015-08-26 MED ORDER — DEXAMETHASONE 4 MG PO TABS
10.0000 mg | ORAL_TABLET | Freq: Once | ORAL | Status: AC
  Administered 2015-08-26: 10 mg via ORAL
  Filled 2015-08-26: qty 3

## 2015-08-26 MED ORDER — METOCLOPRAMIDE HCL 10 MG PO TABS
10.0000 mg | ORAL_TABLET | Freq: Once | ORAL | Status: AC
  Administered 2015-08-26: 10 mg via ORAL
  Filled 2015-08-26: qty 1

## 2015-08-26 MED ORDER — AEROCHAMBER PLUS FLO-VU MEDIUM MISC
1.0000 | Freq: Once | Status: AC
  Administered 2015-08-26: 1
  Filled 2015-08-26: qty 1

## 2015-08-26 MED ORDER — NALOXONE HCL 0.4 MG/ML IJ SOLN: 0.4000 mg | Freq: Once | INTRAMUSCULAR | Status: DC

## 2015-08-26 NOTE — ED Notes (Signed)
Pt transported to xray 

## 2015-08-26 NOTE — ED Notes (Signed)
Pt O2 stats at 88%, placed on 2L [place].  O2 increased to 92%, informed provider.

## 2015-08-26 NOTE — Discharge Instructions (Signed)
Chronic Obstructive Pulmonary Disease Chronic obstructive pulmonary disease (COPD) is a common lung condition in which airflow from the lungs is limited. COPD is a general term that can be used to describe many different lung problems that limit airflow, including both chronic bronchitis and emphysema. If you have COPD, your lung function will probably never return to normal, but there are measures you can take to improve lung function and make yourself feel better. CAUSES   Smoking (common).  Exposure to secondhand smoke.  Genetic problems.  Chronic inflammatory lung diseases or recurrent infections. SYMPTOMS  Shortness of breath, especially with physical activity.  Deep, persistent (chronic) cough with a large amount of thick mucus.  Wheezing.  Rapid breaths (tachypnea).  Gray or bluish discoloration (cyanosis) of the skin, especially in your fingers, toes, or lips.  Fatigue.  Weight loss.  Frequent infections or episodes when breathing symptoms become much worse (exacerbations).  Chest tightness. DIAGNOSIS Your health care provider will take a medical history and perform a physical examination to diagnose COPD. Additional tests for COPD may include:  Lung (pulmonary) function tests.  Chest X-ray.  CT scan.  Blood tests. TREATMENT  Treatment for COPD may include:  Inhaler and nebulizer medicines. These help manage the symptoms of COPD and make your breathing more comfortable.  Supplemental oxygen. Supplemental oxygen is only helpful if you have a low oxygen level in your blood.  Exercise and physical activity. These are beneficial for nearly all people with COPD.  Lung surgery or transplant.  Nutrition therapy to gain weight, if you are underweight.  Pulmonary rehabilitation. This may involve working with a team of health care providers and specialists, such as respiratory, occupational, and physical therapists. HOME CARE INSTRUCTIONS  Take all medicines  (inhaled or pills) as directed by your health care provider.  Avoid over-the-counter medicines or cough syrups that dry up your airway (such as antihistamines) and slow down the elimination of secretions unless instructed otherwise by your health care provider.  If you are a smoker, the most important thing that you can do is stop smoking. Continuing to smoke will cause further lung damage and breathing trouble. Ask your health care provider for help with quitting smoking. He or she can direct you to community resources or hospitals that provide support.  Avoid exposure to irritants such as smoke, chemicals, and fumes that aggravate your breathing.  Use oxygen therapy and pulmonary rehabilitation if directed by your health care provider. If you require home oxygen therapy, ask your health care provider whether you should purchase a pulse oximeter to measure your oxygen level at home.  Avoid contact with individuals who have a contagious illness.  Avoid extreme temperature and humidity changes.  Eat healthy foods. Eating smaller, more frequent meals and resting before meals may help you maintain your strength.  Stay active, but balance activity with periods of rest. Exercise and physical activity will help you maintain your ability to do things you want to do.  Preventing infection and hospitalization is very important when you have COPD. Make sure to receive all the vaccines your health care provider recommends, especially the pneumococcal and influenza vaccines. Ask your health care provider whether you need a pneumonia vaccine.  Learn and use relaxation techniques to manage stress.  Learn and use controlled breathing techniques as directed by your health care provider. Controlled breathing techniques include:  Pursed lip breathing. Start by breathing in (inhaling) through your nose for 1 second. Then, purse your lips as if you were   going to whistle and breathe out (exhale) through the  pursed lips for 2 seconds.  Diaphragmatic breathing. Start by putting one hand on your abdomen just above your waist. Inhale slowly through your nose. The hand on your abdomen should move out. Then purse your lips and exhale slowly. You should be able to feel the hand on your abdomen moving in as you exhale.  Learn and use controlled coughing to clear mucus from your lungs. Controlled coughing is a series of short, progressive coughs. The steps of controlled coughing are: 1. Lean your head slightly forward. 2. Breathe in deeply using diaphragmatic breathing. 3. Try to hold your breath for 3 seconds. 4. Keep your mouth slightly open while coughing twice. 5. Spit any mucus out into a tissue. 6. Rest and repeat the steps once or twice as needed. SEEK MEDICAL CARE IF:  You are coughing up more mucus than usual.  There is a change in the color or thickness of your mucus.  Your breathing is more labored than usual.  Your breathing is faster than usual. SEEK IMMEDIATE MEDICAL CARE IF:  You have shortness of breath while you are resting.  You have shortness of breath that prevents you from:  Being able to talk.  Performing your usual physical activities.  You have chest pain lasting longer than 5 minutes.  Your skin color is more cyanotic than usual.  You measure low oxygen saturations for longer than 5 minutes with a pulse oximeter. MAKE SURE YOU:  Understand these instructions.  Will watch your condition.  Will get help right away if you are not doing well or get worse.   This information is not intended to replace advice given to you by your health care provider. Make sure you discuss any questions you have with your health care provider.   Document Released: 07/16/2005 Document Revised: 10/27/2014 Document Reviewed: 06/02/2013 Elsevier Interactive Patient Education 2016 [employer]  

## 2015-08-26 NOTE — ED Provider Notes (Signed)
CSN: [id number]     Arrival date & time 08/26/15  1835 History   First MD Initiated Contact with Patient 08/26/15 1901     No chief complaint on file.    (Consider location/radiation/quality/duration/timing/severity/associated sxs/prior Treatment) Patient is a 64 y.o. female presenting with cough. The history is provided by the patient.  Cough Cough characteristics:  Non-productive Severity:  Moderate Onset quality:  Gradual Duration:  2 days Timing:  Constant Progression:  Worsening Chronicity:  Recurrent Smoker: yes   Context: sick contacts (grandson with viral illness)   Relieved by:  Nothing Worsened by:  Nothing tried Ineffective treatments: norco. Associated symptoms: headaches and sore throat   Associated symptoms comment:  Few episodes of emesis today and very groggy after pain medications   Past Medical History  Diagnosis Date  . Panic attack   . Back pain   . Vertigo   . Ganglion cyst of wrist    Past Surgical History  Procedure Laterality Date  . Abdominal hysterectomy      partial  . Cystectomy    . Tonsillectomy    . Nasal sinus surgery  1986   No family history on file. Social History  Substance Use Topics  . Smoking status: Current Every Day Smoker -- 1.00 packs/day    Types: Cigarettes  . Smokeless tobacco: None  . Alcohol Use: No   OB History    No data available     Review of Systems  HENT: Positive for sore throat.   Respiratory: Positive for cough.   Neurological: Positive for headaches.  All other systems reviewed and are negative.     Allergies  Bee venom; Penicillins; Sulfa antibiotics; and Tetracyclines & related  Home Medications   Prior to Admission medications   Medication Sig Start Date End Date Taking? Authorizing Provider  albuterol (PROVENTIL HFA;VENTOLIN HFA) 108 (90 BASE) MCG/ACT inhaler Inhale 2 puffs into the lungs every 6 (six) hours as needed for wheezing or shortness of breath.   Yes Historical Provider, MD   albuterol (PROVENTIL) (2.5 MG/3ML) 0.083% nebulizer solution Take 2.5 mg by nebulization as needed for wheezing or shortness of breath.   Yes Historical Provider, MD  benzonatate (TESSALON) 100 MG capsule Take 1 capsule (100 mg total) by mouth 3 (three) times daily as needed for cough. 08/08/15  Yes Alvira Monday, MD  diazepam (VALIUM) 5 MG tablet Take 5 mg by mouth every 6 (six) hours as needed for anxiety.   Yes Historical Provider, MD  fluticasone (FLONASE) 50 MCG/ACT nasal spray Place 2 sprays into both nostrils daily.   Yes Historical Provider, MD  HYDROcodone-acetaminophen (NORCO) 10-325 MG per tablet Take 1 tablet by mouth every 6 (six) hours as needed for moderate pain or severe pain.    Yes Historical Provider, MD  omeprazole (PRILOSEC) 40 MG capsule Take 40 mg by mouth 2 (two) times daily. 08/16/15  Yes Historical Provider, MD   BP 123/69 mmHg  Pulse 98  Temp(Src) 99.7 F (37.6 C) (Oral)  Resp 16  Ht 5' 1.25" (1.556 m)  Wt 132 lb 8 oz (60.102 kg)  BMI 24.82 kg/m2  SpO2 98% Physical Exam  Constitutional: She is oriented to person, place, and time. She appears well-developed and well-nourished. No distress.  Appears sleepy, slowed mentation, inattentive  HENT:  Head: Normocephalic.  Eyes: Conjunctivae and EOM are normal. Pupils are equal, round, and reactive to light.  Bilateral miosis  Neck: Neck supple. No tracheal deviation present.  Cardiovascular: Normal rate and regular  rhythm.   Pulmonary/Chest: Effort normal. No respiratory distress. She has wheezes (bilateral coarse). She has no rales. She exhibits no tenderness.  Abdominal: Soft. She exhibits no distension.  Neurological: She is oriented to person, place, and time. She has normal strength. No cranial nerve deficit. Coordination normal. GCS eye subscore is 4. GCS verbal subscore is 5. GCS motor subscore is 6.  Skin: Skin is warm and dry.  Psychiatric: She has a normal mood and affect.    ED Course  Procedures  (including critical care time) Labs Review Labs Reviewed - No data to display  Imaging Review Dg Chest 2 View  08/26/2015  CLINICAL DATA:  Cough.  Chest pain.  Shortness of breath. EXAM: CHEST  2 VIEW COMPARISON:  08/08/2015 chest radiograph FINDINGS: Stable cardiomediastinal silhouette with normal heart size. No pneumothorax. No pleural effusion. No pulmonary edema. Emphysema. Mildly hyperinflated lungs. Platelike atelectasis in the left anterior mid lung. No focal lung consolidation. Curvilinear opacities at the lung bases likely represent scarring or atelectasis. IMPRESSION: 1. Curvilinear opacities in the anterior mid to lower left lung and at the right lung base, likely scarring or atelectasis. No focal lung consolidation to suggest a pneumonia. 2. Emphysema and mildly hyperinflated lungs, suggesting COPD. Electronically Signed   By: Delbert PhenixJason A Poff M.D.   On: 08/26/2015 22:01   I have personally reviewed and evaluated these images and lab results as part of my medical decision-making.   EKG Interpretation None      MDM   Final diagnoses:  Viral upper respiratory illness  Pulmonary emphysema, unspecified emphysema type (HCC)  Adverse effect of other opioids, initial encounter    64 year old female. Appears over medicated on Vicodin that she took throughout the day for her headache. She has emphysema and was recently exposed to upper respiratory illness by her younger family member. She has a chronic ongoing cough for a long period of time and continues to smoke one pack daily. She is mildly hypoxemic but asymptomatic at rest. She was given bronchodilators to help with her cough. No evidence of pneumonia on chest x-ray. Narcan improved her grogginess, given Reglan and Decadron for headache and reported sore throat with improvement. Patient wishing to leave but her in the course of her stay she is mildly hypotensive with some orthostasis likely secondary to volume loss with recent episodes of  vomiting. She'll be given 2 L of IV fluid and if she recovers she may go home with primary care follow-up for ongoing smoking related lung disease. I recommended that she not take any more narcotic medications.    Lyndal Pulleyaniel Sammie Denner, MD 08/28/15 2126

## 2015-08-26 NOTE — ED Notes (Signed)
Pt c/o HA onset today @ noon, pt reports taking 10 mg Vicodin today @ 16: without relief, pt c/o n/v, denies diarrhea, pt reports x4 vomiting episodes today, hx of migraines, pt reports dizziness & blurred vision, & light sensitivity, denies slurred speech, no facial droop

## 2015-08-27 DIAGNOSIS — F410 Panic disorder [episodic paroxysmal anxiety] without agoraphobia: Secondary | ICD-10-CM | POA: Diagnosis not present

## 2015-08-27 DIAGNOSIS — T402X5A Adverse effect of other opioids, initial encounter: Secondary | ICD-10-CM | POA: Diagnosis not present

## 2015-08-27 DIAGNOSIS — J439 Emphysema, unspecified: Secondary | ICD-10-CM | POA: Diagnosis not present

## 2015-08-27 DIAGNOSIS — Z880 Allergy status to penicillin: Secondary | ICD-10-CM | POA: Diagnosis not present

## 2015-08-27 DIAGNOSIS — H5703 Miosis: Secondary | ICD-10-CM | POA: Diagnosis not present

## 2015-08-27 DIAGNOSIS — Z7951 Long term (current) use of inhaled steroids: Secondary | ICD-10-CM | POA: Diagnosis not present

## 2015-08-27 DIAGNOSIS — J069 Acute upper respiratory infection, unspecified: Secondary | ICD-10-CM | POA: Diagnosis not present

## 2015-08-27 DIAGNOSIS — Z79899 Other long term (current) drug therapy: Secondary | ICD-10-CM | POA: Diagnosis not present

## 2015-08-27 DIAGNOSIS — Z720 Tobacco use: Secondary | ICD-10-CM | POA: Diagnosis not present

## 2015-08-27 MED ORDER — ALBUTEROL SULFATE HFA 108 (90 BASE) MCG/ACT IN AERS: 2.0000 | INHALATION_SPRAY | RESPIRATORY_TRACT | Status: DC | PRN

## 2015-08-27 MED ORDER — PREDNISONE 20 MG PO TABS: 60.0000 mg | ORAL_TABLET | Freq: Every day | ORAL | Status: DC

## 2015-08-27 NOTE — ED Provider Notes (Addendum)
12:30 AM  Assumed care from Dr. Clydene PughKnott.  Pt is a 64 y.o. female with history of tobacco use who presents emergency department with cough, nasal congestion and dyspnea for 3 weeks. Also had a headache today for which she took Vicodin at home. Patient came in somnolent and required Narcan. This improved her mental status. Chest received albuterol treatments in the ED for mild hypoxia, wheezing. Lungs are now clear except for mild by Basler crackles. She reports her shortness of breath is now gone. Chest x-ray showed no infiltrate, pneumothorax or pulmonary edema. Oxygen saturation is now 100% on room air at rest. Prior to discharge patient did have an episode of hypotension. She states she felt dizzy but has now improved. She states her blood pressure is normally in the 90s systolic. Blood pressure in the room is 99/67 with a heart rate of 103. She has received approximately 500 mL of 2 L of fluid. She is asking when she can go home. She refuses admission. We'll continue to hydrate patient and reassess.   2:00 AM  Pt's blood pressure has improved slightly but she still has some systolic blood pressures in the upper 90s. Heart rate in the low 90s. Review of her records she has had blood pressures in the 90s several times in the past. She reports that she does not feel dizzy. Was able to ambulate without difficulty and her oxygen saturation did not drop below 95%. She is requesting to go home. Refuses admission.  Her son is coming to pick her up in the emergency department. I feel she is safe to be discharged. We'll discharge with prescription for albuterol and steroids. She received Decadron in the emergency department as well as Reglan to help with her migraine. She has not had any vomiting in the past several hours. Has been able to drink without difficulty. Discussed strict return precautions. She verbalized understanding and is comfortable with this plan.  Layla MawKristen N Ward, DO 08/27/15 0200  Layla MawKristen N Ward,  DO 08/27/15 [phone]

## 2015-08-27 NOTE — ED Notes (Signed)
While ambulating to the bathroom, pt maintained an O2 level between 95% - 100%.

## 2015-09-24 DIAGNOSIS — N898 Other specified noninflammatory disorders of vagina: Secondary | ICD-10-CM | POA: Diagnosis not present

## 2015-09-24 DIAGNOSIS — Z6824 Body mass index (BMI) 24.0-24.9, adult: Secondary | ICD-10-CM | POA: Diagnosis not present

## 2015-09-24 DIAGNOSIS — N390 Urinary tract infection, site not specified: Secondary | ICD-10-CM | POA: Diagnosis not present

## 2015-09-24 DIAGNOSIS — Z1389 Encounter for screening for other disorder: Secondary | ICD-10-CM | POA: Diagnosis not present

## 2015-09-24 DIAGNOSIS — H6691 Otitis media, unspecified, right ear: Secondary | ICD-10-CM | POA: Diagnosis not present

## 2015-10-31 DIAGNOSIS — Z6825 Body mass index (BMI) 25.0-25.9, adult: Secondary | ICD-10-CM | POA: Diagnosis not present

## 2015-10-31 DIAGNOSIS — F419 Anxiety disorder, unspecified: Secondary | ICD-10-CM | POA: Diagnosis not present

## 2015-10-31 DIAGNOSIS — J449 Chronic obstructive pulmonary disease, unspecified: Secondary | ICD-10-CM | POA: Diagnosis not present

## 2015-10-31 DIAGNOSIS — H7291 Unspecified perforation of tympanic membrane, right ear: Secondary | ICD-10-CM | POA: Diagnosis not present

## 2015-10-31 DIAGNOSIS — Z1389 Encounter for screening for other disorder: Secondary | ICD-10-CM | POA: Diagnosis not present

## 2015-12-13 ENCOUNTER — Ambulatory Visit (INDEPENDENT_AMBULATORY_CARE_PROVIDER_SITE_OTHER): Payer: Commercial Managed Care - HMO | Admitting: Otolaryngology

## 2015-12-13 DIAGNOSIS — H6121 Impacted cerumen, right ear: Secondary | ICD-10-CM | POA: Diagnosis not present

## 2015-12-13 DIAGNOSIS — H903 Sensorineural hearing loss, bilateral: Secondary | ICD-10-CM

## 2015-12-13 DIAGNOSIS — H7201 Central perforation of tympanic membrane, right ear: Secondary | ICD-10-CM | POA: Diagnosis not present

## 2016-02-12 DIAGNOSIS — G894 Chronic pain syndrome: Secondary | ICD-10-CM | POA: Diagnosis not present

## 2016-02-12 DIAGNOSIS — J449 Chronic obstructive pulmonary disease, unspecified: Secondary | ICD-10-CM | POA: Diagnosis not present

## 2016-02-12 DIAGNOSIS — Z6825 Body mass index (BMI) 25.0-25.9, adult: Secondary | ICD-10-CM | POA: Diagnosis not present

## 2016-02-12 DIAGNOSIS — Z1389 Encounter for screening for other disorder: Secondary | ICD-10-CM | POA: Diagnosis not present

## 2016-02-12 DIAGNOSIS — F419 Anxiety disorder, unspecified: Secondary | ICD-10-CM | POA: Diagnosis not present

## 2016-05-09 DIAGNOSIS — J449 Chronic obstructive pulmonary disease, unspecified: Secondary | ICD-10-CM | POA: Diagnosis not present

## 2016-05-09 DIAGNOSIS — F419 Anxiety disorder, unspecified: Secondary | ICD-10-CM | POA: Diagnosis not present

## 2016-05-09 DIAGNOSIS — G894 Chronic pain syndrome: Secondary | ICD-10-CM | POA: Diagnosis not present

## 2016-05-09 DIAGNOSIS — Z6825 Body mass index (BMI) 25.0-25.9, adult: Secondary | ICD-10-CM | POA: Diagnosis not present

## 2016-06-26 DIAGNOSIS — H905 Unspecified sensorineural hearing loss: Secondary | ICD-10-CM | POA: Diagnosis not present

## 2016-08-08 DIAGNOSIS — J449 Chronic obstructive pulmonary disease, unspecified: Secondary | ICD-10-CM | POA: Diagnosis not present

## 2016-08-08 DIAGNOSIS — G894 Chronic pain syndrome: Secondary | ICD-10-CM | POA: Diagnosis not present

## 2016-08-08 DIAGNOSIS — Z1389 Encounter for screening for other disorder: Secondary | ICD-10-CM | POA: Diagnosis not present

## 2016-08-08 DIAGNOSIS — N760 Acute vaginitis: Secondary | ICD-10-CM | POA: Diagnosis not present

## 2016-08-08 DIAGNOSIS — Z6824 Body mass index (BMI) 24.0-24.9, adult: Secondary | ICD-10-CM | POA: Diagnosis not present

## 2016-08-11 ENCOUNTER — Other Ambulatory Visit (HOSPITAL_COMMUNITY): Payer: Self-pay | Admitting: Internal Medicine

## 2016-08-11 DIAGNOSIS — Z1231 Encounter for screening mammogram for malignant neoplasm of breast: Secondary | ICD-10-CM

## 2016-08-21 ENCOUNTER — Encounter: Payer: Self-pay | Admitting: *Deleted

## 2016-09-01 ENCOUNTER — Ambulatory Visit (INDEPENDENT_AMBULATORY_CARE_PROVIDER_SITE_OTHER): Payer: Commercial Managed Care - HMO | Admitting: Obstetrics & Gynecology

## 2016-09-01 ENCOUNTER — Other Ambulatory Visit: Payer: Self-pay | Admitting: Obstetrics & Gynecology

## 2016-09-01 ENCOUNTER — Encounter: Payer: Self-pay | Admitting: Obstetrics & Gynecology

## 2016-09-01 VITALS — BP 110/80 | HR 84 | Ht 61.0 in | Wt 129.0 lb

## 2016-09-01 DIAGNOSIS — K5733 Diverticulitis of large intestine without perforation or abscess with bleeding: Secondary | ICD-10-CM | POA: Diagnosis not present

## 2016-09-01 DIAGNOSIS — R1030 Lower abdominal pain, unspecified: Secondary | ICD-10-CM | POA: Diagnosis not present

## 2016-09-01 DIAGNOSIS — R300 Dysuria: Secondary | ICD-10-CM | POA: Diagnosis not present

## 2016-09-01 DIAGNOSIS — R319 Hematuria, unspecified: Secondary | ICD-10-CM | POA: Diagnosis not present

## 2016-09-01 LAB — POCT URINALYSIS DIPSTICK
GLUCOSE UA: NEGATIVE
KETONES UA: NEGATIVE
NITRITE UA: NEGATIVE
Protein, UA: NEGATIVE
RBC UA: 2

## 2016-09-01 MED ORDER — POLYETHYLENE GLYCOL 3350 17 GM/SCOOP PO POWD: ORAL | 11 refills | Status: AC

## 2016-09-01 MED ORDER — CIPROFLOXACIN HCL 500 MG PO TABS: 500.0000 mg | ORAL_TABLET | Freq: Two times a day (BID) | ORAL | 0 refills | Status: DC

## 2016-09-01 MED ORDER — METRONIDAZOLE 500 MG PO TABS: 500.0000 mg | ORAL_TABLET | Freq: Two times a day (BID) | ORAL | 0 refills | Status: DC

## 2016-09-01 NOTE — Addendum Note (Signed)
Addended by: Federico FlakeNES, PEGGY A on: 09/01/2016 04:31 PM   Modules accepted: Orders

## 2016-09-01 NOTE — Progress Notes (Signed)
Chief Complaint  Patient presents with  . Referral    burn when pee/ lower stomach pain    Blood pressure 110/80, pulse 84, height 5\' 1"  (1.549 m), weight 129 lb (58.5 kg).  65 y.o. No obstetric history on file. No LMP recorded. Patient has had a hysterectomy. The current method of family planning is status post hysterectomy.  Outpatient Encounter Prescriptions as of 09/01/2016  Medication Sig Note  . albuterol (PROVENTIL HFA;VENTOLIN HFA) 108 (90 BASE) MCG/ACT inhaler Inhale 2 puffs into the lungs every 4 (four) hours as needed for wheezing or shortness of breath.   . cyanocobalamin 100 MCG tablet Take 100 mcg by mouth daily.   . diazepam (VALIUM) 5 MG tablet Take 5 mg by mouth 3 (three) times daily.    . fluticasone (FLONASE) 50 MCG/ACT nasal spray Place 2 sprays into both nostrils daily. 08/26/2015: Patient uses her grandson's prescription, does not have her own.  Marland Kitchen. HYDROcodone-acetaminophen (NORCO) 10-325 MG per tablet Take 1 tablet by mouth every 6 (six) hours as needed for moderate pain or severe pain.    . pantoprazole (PROTONIX) 40 MG tablet Take 40 mg by mouth daily.   Marland Kitchen. albuterol (PROVENTIL HFA;VENTOLIN HFA) 108 (90 BASE) MCG/ACT inhaler Inhale 2 puffs into the lungs every 6 (six) hours as needed for wheezing or shortness of breath. 08/26/2015: She does not have a prescription for this, she uses her boyfriends inhalers.  . ciprofloxacin (CIPRO) 500 MG tablet Take 1 tablet (500 mg total) by mouth 2 (two) times daily.   . metroNIDAZOLE (FLAGYL) 500 MG tablet Take 1 tablet (500 mg total) by mouth 2 (two) times daily.   . nicotine (NICODERM CQ - DOSED IN MG/24 HOURS) 21 mg/24hr patch Place 21 mg onto the skin daily.   Marland Kitchen. omeprazole (PRILOSEC) 40 MG capsule Take 40 mg by mouth 2 (two) times daily. 08/26/2015: Received from: External Pharmacy Received Sig:   . polyethylene glycol powder (GLYCOLAX/MIRALAX) powder 1 scoop daily or as needed   . [DISCONTINUED] albuterol (PROVENTIL)  (2.5 MG/3ML) 0.083% nebulizer solution Take 2.5 mg by nebulization as needed for wheezing or shortness of breath.   . [DISCONTINUED] benzonatate (TESSALON) 100 MG capsule Take 1 capsule (100 mg total) by mouth 3 (three) times daily as needed for cough.   . [DISCONTINUED] predniSONE (DELTASONE) 20 MG tablet Take 3 tablets (60 mg total) by mouth daily.    No facility-administered encounter medications on file as of 09/01/2016.     Subjective Pt complaining of several days of lower abdominal pain, hurts when she urinates, frequency No fever chills or diarrhea No blood in stool or urine Constipation but no diarrhea As far as she knows she has never had diverticulitis  Objective General WDWN female NAD Abdomen soft tender in the LLQ>than anywhere else no rebound Vulva:  normal appearing vulva with no masses, tenderness or lesions Vagina:  normal mucosa, no discharge Cervix:  absent Uterus:  uterus absent Adnexa: ovaries:,  normal adnexa in size, tender in both lower quadrants r lef grater than right  and no masses   Pertinent ROS No burning with urination, frequency or urgency No nausea, vomiting or diarrhea Nor fever chills or other constitutional symptoms   Labs or studies ua negative    Impression Diagnoses this Encounter::   ICD-9-CM ICD-10-CM   1. Diverticulitis large intestine w/o perforation or abscess w/bleeding 562.13 K57.33     Established relevant diagnosis(es):   Plan/Recommendations: Meds ordered this encounter  Medications  . ciprofloxacin (CIPRO) 500 MG tablet    Sig: Take 1 tablet (500 mg total) by mouth 2 (two) times daily.    Dispense:  20 tablet    Refill:  0  . metroNIDAZOLE (FLAGYL) 500 MG tablet    Sig: Take 1 tablet (500 mg total) by mouth 2 (two) times daily.    Dispense:  20 tablet    Refill:  0  . polyethylene glycol powder (GLYCOLAX/MIRALAX) powder    Sig: 1 scoop daily or as needed    Dispense:  255 g    Refill:  11    Labs or Scans  Ordered: No orders of the defined types were placed in this encounter.   Management:: Will treat as if possible diverticulitis with cipro/flagyl miralax powder for chronci narcotic use constipation  Follow up Return in about 2 weeks (around 09/15/2016) for Follow up, with Dr Despina HiddenEure.     All questions were answered.  Past Medical History:  Diagnosis Date  . Back pain   . COPD (chronic obstructive pulmonary disease) (HCC)   . Ganglion cyst of wrist   . Mental disorder    anxiety  . Panic attack   . Vertigo     Past Surgical History:  Procedure Laterality Date  . ABDOMINAL HYSTERECTOMY     partial  . CYSTECTOMY    . NASAL SINUS SURGERY  1986  . TONSILLECTOMY      OB History    No data available      Allergies  Allergen Reactions  . Bee Venom Shortness Of Breath and Swelling  . Fluconazole Shortness Of Breath  . Bupropion     Suicidal thoughts  . Penicillins Other (See Comments)    syncope  . Prednisone Other (See Comments)    anxiety  . Remeron [Mirtazapine]   . Sulfa Antibiotics Other (See Comments)    Incontinence   . Tetracyclines & Related Nausea And Vomiting and Other (See Comments)    Migraine    Social History   Social History  . Marital status: Single    Spouse name: N/A  . Number of children: N/A  . Years of education: N/A   Social History Main Topics  . Smoking status: Current Every Day Smoker    Packs/day: 1.00    Types: Cigarettes  . Smokeless tobacco: Current User  . Alcohol use No  . Drug use: No  . Sexual activity: Not Asked   Other Topics Concern  . None   Social History Narrative  . None    History reviewed. No pertinent family history.

## 2016-09-03 ENCOUNTER — Ambulatory Visit (HOSPITAL_COMMUNITY)
Admission: RE | Admit: 2016-09-03 | Discharge: 2016-09-03 | Disposition: A | Discharge status: Home / Self Care | Payer: Commercial Managed Care - HMO | Source: Ambulatory Visit | Attending: Internal Medicine | Admitting: Internal Medicine

## 2016-09-03 DIAGNOSIS — Z1231 Encounter for screening mammogram for malignant neoplasm of breast: Secondary | ICD-10-CM | POA: Insufficient documentation

## 2016-09-03 LAB — URINE CULTURE

## 2016-09-22 ENCOUNTER — Encounter: Payer: Self-pay | Admitting: Obstetrics & Gynecology

## 2016-09-22 ENCOUNTER — Ambulatory Visit (INDEPENDENT_AMBULATORY_CARE_PROVIDER_SITE_OTHER): Payer: Commercial Managed Care - HMO | Admitting: Obstetrics & Gynecology

## 2016-09-22 VITALS — BP 100/60 | HR 76 | Wt 130.3 lb

## 2016-09-22 DIAGNOSIS — K641 Second degree hemorrhoids: Secondary | ICD-10-CM

## 2016-09-22 DIAGNOSIS — K5733 Diverticulitis of large intestine without perforation or abscess with bleeding: Secondary | ICD-10-CM

## 2016-09-22 MED ORDER — HYDROCORTISONE 2.5 % RE CREA: 1.0000 "application " | TOPICAL_CREAM | Freq: Two times a day (BID) | RECTAL | 0 refills | Status: DC

## 2016-09-22 NOTE — Progress Notes (Signed)
Chief Complaint  Patient presents with  . folloe-wp    Blood pressure 100/60, pulse 76, weight 130 lb 4.8 oz (59.1 kg).  65 y.o. No obstetric history on file. No LMP recorded. Patient has had a hysterectomy. The current method of family planning is status post hysterectomy.  Outpatient Encounter Prescriptions as of 09/22/2016  Medication Sig Note  . albuterol (PROVENTIL HFA;VENTOLIN HFA) 108 (90 BASE) MCG/ACT inhaler Inhale 2 puffs into the lungs every 6 (six) hours as needed for wheezing or shortness of breath. 08/26/2015: She does not have a prescription for this, she uses her boyfriends inhalers.  . diazepam (VALIUM) 5 MG tablet Take 5 mg by mouth 3 (three) times daily.    Marland Kitchen. HYDROcodone-acetaminophen (NORCO) 10-325 MG per tablet Take 1 tablet by mouth every 6 (six) hours as needed for moderate pain or severe pain.    . pantoprazole (PROTONIX) 40 MG tablet Take 40 mg by mouth daily.   . polyethylene glycol powder (GLYCOLAX/MIRALAX) powder 1 scoop daily or as needed   . hydrocortisone (ANUSOL-HC) 2.5 % rectal cream Place 1 application rectally 2 (two) times daily.   . [DISCONTINUED] albuterol (PROVENTIL HFA;VENTOLIN HFA) 108 (90 BASE) MCG/ACT inhaler Inhale 2 puffs into the lungs every 4 (four) hours as needed for wheezing or shortness of breath.   . [DISCONTINUED] ciprofloxacin (CIPRO) 500 MG tablet Take 1 tablet (500 mg total) by mouth 2 (two) times daily.   . [DISCONTINUED] cyanocobalamin 100 MCG tablet Take 100 mcg by mouth daily.   . [DISCONTINUED] fluticasone (FLONASE) 50 MCG/ACT nasal spray Place 2 sprays into both nostrils daily. 08/26/2015: Patient uses her grandson's prescription, does not have her own.  . [DISCONTINUED] metroNIDAZOLE (FLAGYL) 500 MG tablet Take 1 tablet (500 mg total) by mouth 2 (two) times daily.   . [DISCONTINUED] nicotine (NICODERM CQ - DOSED IN MG/24 HOURS) 21 mg/24hr patch Place 21 mg onto the skin daily.   . [DISCONTINUED] omeprazole (PRILOSEC) 40 MG  capsule Take 40 mg by mouth 2 (two) times daily. 08/26/2015: Received from: External Pharmacy Received Sig:    No facility-administered encounter medications on file as of 09/22/2016.     Subjective Pt was seen 3 weeks ago with lower abdominal pain which I diagnosed as diverticulitis, finished a course of cipro and flagyl Pt feels much better no fever  Pain resolved  Objective Abdomen soft non tender no guarding rebound  Pertinent ROS No burning with urination, frequency or urgency No nausea, vomiting or diarrhea Nor fever chills or other constitutional symptoms   Labs or studies     Impression Diagnoses this Encounter::   ICD-9-CM ICD-10-CM   1. Diverticulitis large intestine w/o perforation or abscess w/bleeding 562.13 K57.33   2. Grade II hemorrhoids 455.0 K64.1     Established relevant diagnosis(es):   Plan/Recommendations: Meds ordered this encounter  Medications  . hydrocortisone (ANUSOL-HC) 2.5 % rectal cream    Sig: Place 1 application rectally 2 (two) times daily.    Dispense:  30 g    Refill:  0    Labs or Scans Ordered: No orders of the defined types were placed in this encounter.   Management:: Rx for anusol 2.5% No further therapy for diverticulitis needed  Follow up Return if symptoms worsen or fail to improve.        Face to face time:  15 minutes  Greater than 50% of the visit time was spent in counseling and coordination of care with the patient.  The summary and outline of the counseling and care coordination is summarized in the note above.   All questions were answered.  Past Medical History:  Diagnosis Date  . Back pain   . COPD (chronic obstructive pulmonary disease) (HCC)   . Ganglion cyst of wrist   . Mental disorder    anxiety  . Panic attack   . Vertigo     Past Surgical History:  Procedure Laterality Date  . ABDOMINAL HYSTERECTOMY     partial  . CYSTECTOMY    . NASAL SINUS SURGERY  1986  . TONSILLECTOMY       OB History    No data available      Allergies  Allergen Reactions  . Bee Venom Shortness Of Breath and Swelling  . Fluconazole Shortness Of Breath  . Bupropion     Suicidal thoughts  . Penicillins Other (See Comments)    syncope  . Prednisone Other (See Comments)    anxiety  . Remeron [Mirtazapine]   . Sulfa Antibiotics Other (See Comments)    Incontinence   . Tetracyclines & Related Nausea And Vomiting and Other (See Comments)    Migraine    Social History   Social History  . Marital status: Single    Spouse name: N/A  . Number of children: N/A  . Years of education: N/A   Social History Main Topics  . Smoking status: Current Every Day Smoker    Packs/day: 1.00    Types: Cigarettes  . Smokeless tobacco: Current User  . Alcohol use No  . Drug use: No  . Sexual activity: Not Asked   Other Topics Concern  . None   Social History Narrative  . None    No family history on file.

## 2016-09-30 DIAGNOSIS — H5200 Hypermetropia, unspecified eye: Secondary | ICD-10-CM | POA: Diagnosis not present

## 2016-09-30 DIAGNOSIS — Z0100 Encounter for examination of eyes and vision without abnormal findings: Secondary | ICD-10-CM | POA: Diagnosis not present

## 2016-10-21 DIAGNOSIS — J449 Chronic obstructive pulmonary disease, unspecified: Secondary | ICD-10-CM | POA: Diagnosis not present

## 2016-10-21 DIAGNOSIS — F1120 Opioid dependence, uncomplicated: Secondary | ICD-10-CM | POA: Diagnosis not present

## 2016-10-21 DIAGNOSIS — J329 Chronic sinusitis, unspecified: Secondary | ICD-10-CM | POA: Diagnosis not present

## 2016-10-21 DIAGNOSIS — F419 Anxiety disorder, unspecified: Secondary | ICD-10-CM | POA: Diagnosis not present

## 2016-10-21 DIAGNOSIS — Z6824 Body mass index (BMI) 24.0-24.9, adult: Secondary | ICD-10-CM | POA: Diagnosis not present

## 2016-10-21 DIAGNOSIS — G894 Chronic pain syndrome: Secondary | ICD-10-CM | POA: Diagnosis not present

## 2016-10-21 DIAGNOSIS — Z1389 Encounter for screening for other disorder: Secondary | ICD-10-CM | POA: Diagnosis not present

## 2017-02-09 DIAGNOSIS — Z6824 Body mass index (BMI) 24.0-24.9, adult: Secondary | ICD-10-CM | POA: Diagnosis not present

## 2017-02-09 DIAGNOSIS — M7121 Synovial cyst of popliteal space [Baker], right knee: Secondary | ICD-10-CM | POA: Diagnosis not present

## 2017-02-09 DIAGNOSIS — F419 Anxiety disorder, unspecified: Secondary | ICD-10-CM | POA: Diagnosis not present

## 2017-02-09 DIAGNOSIS — G894 Chronic pain syndrome: Secondary | ICD-10-CM | POA: Diagnosis not present

## 2017-02-23 DIAGNOSIS — M1711 Unilateral primary osteoarthritis, right knee: Secondary | ICD-10-CM | POA: Diagnosis not present

## 2017-02-23 DIAGNOSIS — M25561 Pain in right knee: Secondary | ICD-10-CM | POA: Diagnosis not present

## 2017-03-05 ENCOUNTER — Telehealth: Payer: Self-pay | Admitting: Orthopedic Surgery

## 2017-03-05 ENCOUNTER — Encounter: Payer: Self-pay | Admitting: Orthopedic Surgery

## 2017-03-05 NOTE — Telephone Encounter (Signed)
We received referral from PCP.  Tried to call the patient several times but couldn't leave a voicemail at her home or work numbers.  Sent letter to patient asking them to call the office to set up appointment.

## 2017-03-09 ENCOUNTER — Other Ambulatory Visit (HOSPITAL_COMMUNITY): Payer: Self-pay | Admitting: Sports Medicine

## 2017-03-09 DIAGNOSIS — S83231D Complex tear of medial meniscus, current injury, right knee, subsequent encounter: Secondary | ICD-10-CM | POA: Diagnosis not present

## 2017-03-09 DIAGNOSIS — M25561 Pain in right knee: Secondary | ICD-10-CM

## 2017-03-13 ENCOUNTER — Ambulatory Visit (HOSPITAL_COMMUNITY)
Admission: RE | Admit: 2017-03-13 | Discharge: 2017-03-13 | Disposition: A | Discharge status: Home / Self Care | Payer: Medicare HMO | Source: Ambulatory Visit | Attending: Sports Medicine | Admitting: Sports Medicine

## 2017-03-13 DIAGNOSIS — X58XXXA Exposure to other specified factors, initial encounter: Secondary | ICD-10-CM | POA: Diagnosis not present

## 2017-03-13 DIAGNOSIS — M25561 Pain in right knee: Secondary | ICD-10-CM | POA: Insufficient documentation

## 2017-03-13 DIAGNOSIS — S83241A Other tear of medial meniscus, current injury, right knee, initial encounter: Secondary | ICD-10-CM | POA: Diagnosis not present

## 2017-03-17 DIAGNOSIS — S83281D Other tear of lateral meniscus, current injury, right knee, subsequent encounter: Secondary | ICD-10-CM | POA: Diagnosis not present

## 2017-03-17 DIAGNOSIS — S83231D Complex tear of medial meniscus, current injury, right knee, subsequent encounter: Secondary | ICD-10-CM | POA: Diagnosis not present

## 2017-04-02 DIAGNOSIS — S83241A Other tear of medial meniscus, current injury, right knee, initial encounter: Secondary | ICD-10-CM | POA: Diagnosis not present

## 2017-04-02 DIAGNOSIS — S83281A Other tear of lateral meniscus, current injury, right knee, initial encounter: Secondary | ICD-10-CM | POA: Diagnosis not present

## 2017-04-02 DIAGNOSIS — M94261 Chondromalacia, right knee: Secondary | ICD-10-CM | POA: Diagnosis not present

## 2017-04-02 DIAGNOSIS — G8918 Other acute postprocedural pain: Secondary | ICD-10-CM | POA: Diagnosis not present

## 2017-04-02 DIAGNOSIS — M23351 Other meniscus derangements, posterior horn of lateral meniscus, right knee: Secondary | ICD-10-CM | POA: Diagnosis not present

## 2017-04-02 DIAGNOSIS — M23321 Other meniscus derangements, posterior horn of medial meniscus, right knee: Secondary | ICD-10-CM | POA: Diagnosis not present

## 2017-04-09 DIAGNOSIS — S83281D Other tear of lateral meniscus, current injury, right knee, subsequent encounter: Secondary | ICD-10-CM | POA: Diagnosis not present

## 2017-04-09 DIAGNOSIS — S83241D Other tear of medial meniscus, current injury, right knee, subsequent encounter: Secondary | ICD-10-CM | POA: Diagnosis not present

## 2017-04-21 DIAGNOSIS — S83241D Other tear of medial meniscus, current injury, right knee, subsequent encounter: Secondary | ICD-10-CM | POA: Diagnosis not present

## 2017-04-21 DIAGNOSIS — M25561 Pain in right knee: Secondary | ICD-10-CM | POA: Diagnosis not present

## 2017-04-21 DIAGNOSIS — S83281D Other tear of lateral meniscus, current injury, right knee, subsequent encounter: Secondary | ICD-10-CM | POA: Diagnosis not present

## 2017-04-25 DIAGNOSIS — Z1211 Encounter for screening for malignant neoplasm of colon: Secondary | ICD-10-CM | POA: Diagnosis not present

## 2017-05-04 DIAGNOSIS — S83241D Other tear of medial meniscus, current injury, right knee, subsequent encounter: Secondary | ICD-10-CM | POA: Diagnosis not present

## 2017-05-07 DIAGNOSIS — F419 Anxiety disorder, unspecified: Secondary | ICD-10-CM | POA: Diagnosis not present

## 2017-05-07 DIAGNOSIS — Z1389 Encounter for screening for other disorder: Secondary | ICD-10-CM | POA: Diagnosis not present

## 2017-05-07 DIAGNOSIS — Z6823 Body mass index (BMI) 23.0-23.9, adult: Secondary | ICD-10-CM | POA: Diagnosis not present

## 2017-05-07 DIAGNOSIS — K140 Glossitis: Secondary | ICD-10-CM | POA: Diagnosis not present

## 2017-05-07 DIAGNOSIS — G894 Chronic pain syndrome: Secondary | ICD-10-CM | POA: Diagnosis not present

## 2017-05-07 DIAGNOSIS — J449 Chronic obstructive pulmonary disease, unspecified: Secondary | ICD-10-CM | POA: Diagnosis not present

## 2017-05-07 DIAGNOSIS — J329 Chronic sinusitis, unspecified: Secondary | ICD-10-CM | POA: Diagnosis not present

## 2017-05-07 DIAGNOSIS — B370 Candidal stomatitis: Secondary | ICD-10-CM | POA: Diagnosis not present

## 2017-05-11 ENCOUNTER — Encounter (HOSPITAL_COMMUNITY): Payer: Self-pay

## 2017-05-11 ENCOUNTER — Emergency Department (HOSPITAL_COMMUNITY): Payer: Medicare HMO

## 2017-05-11 ENCOUNTER — Inpatient Hospital Stay (HOSPITAL_COMMUNITY)
Admission: EM | Admit: 2017-05-11 | Discharge: 2017-05-13 | DRG: 101 | Disposition: A | Discharge status: Home / Self Care | Payer: Medicare HMO | Attending: Internal Medicine | Admitting: Internal Medicine

## 2017-05-11 DIAGNOSIS — F411 Generalized anxiety disorder: Secondary | ICD-10-CM | POA: Diagnosis not present

## 2017-05-11 DIAGNOSIS — T380X5A Adverse effect of glucocorticoids and synthetic analogues, initial encounter: Secondary | ICD-10-CM | POA: Diagnosis present

## 2017-05-11 DIAGNOSIS — F17210 Nicotine dependence, cigarettes, uncomplicated: Secondary | ICD-10-CM | POA: Diagnosis present

## 2017-05-11 DIAGNOSIS — Z79899 Other long term (current) drug therapy: Secondary | ICD-10-CM | POA: Diagnosis not present

## 2017-05-11 DIAGNOSIS — J449 Chronic obstructive pulmonary disease, unspecified: Secondary | ICD-10-CM | POA: Diagnosis present

## 2017-05-11 DIAGNOSIS — I361 Nonrheumatic tricuspid (valve) insufficiency: Secondary | ICD-10-CM | POA: Diagnosis not present

## 2017-05-11 DIAGNOSIS — Z9889 Other specified postprocedural states: Secondary | ICD-10-CM | POA: Diagnosis not present

## 2017-05-11 DIAGNOSIS — Z880 Allergy status to penicillin: Secondary | ICD-10-CM | POA: Diagnosis not present

## 2017-05-11 DIAGNOSIS — Z681 Body mass index (BMI) 19 or less, adult: Secondary | ICD-10-CM | POA: Diagnosis not present

## 2017-05-11 DIAGNOSIS — F19950 Other psychoactive substance use, unspecified with psychoactive substance-induced psychotic disorder with delusions: Secondary | ICD-10-CM | POA: Diagnosis present

## 2017-05-11 DIAGNOSIS — G894 Chronic pain syndrome: Secondary | ICD-10-CM | POA: Diagnosis not present

## 2017-05-11 DIAGNOSIS — Z888 Allergy status to other drugs, medicaments and biological substances status: Secondary | ICD-10-CM | POA: Diagnosis not present

## 2017-05-11 DIAGNOSIS — M7051 Other bursitis of knee, right knee: Secondary | ICD-10-CM | POA: Diagnosis present

## 2017-05-11 DIAGNOSIS — Z90711 Acquired absence of uterus with remaining cervical stump: Secondary | ICD-10-CM | POA: Diagnosis not present

## 2017-05-11 DIAGNOSIS — F419 Anxiety disorder, unspecified: Secondary | ICD-10-CM | POA: Diagnosis not present

## 2017-05-11 DIAGNOSIS — F22 Delusional disorders: Secondary | ICD-10-CM | POA: Diagnosis present

## 2017-05-11 DIAGNOSIS — R079 Chest pain, unspecified: Secondary | ICD-10-CM | POA: Diagnosis not present

## 2017-05-11 DIAGNOSIS — R569 Unspecified convulsions: Secondary | ICD-10-CM | POA: Diagnosis present

## 2017-05-11 DIAGNOSIS — G40409 Other generalized epilepsy and epileptic syndromes, not intractable, without status epilepticus: Principal | ICD-10-CM | POA: Diagnosis present

## 2017-05-11 DIAGNOSIS — R4182 Altered mental status, unspecified: Secondary | ICD-10-CM | POA: Diagnosis not present

## 2017-05-11 DIAGNOSIS — F418 Other specified anxiety disorders: Secondary | ICD-10-CM | POA: Diagnosis present

## 2017-05-11 DIAGNOSIS — R4701 Aphasia: Secondary | ICD-10-CM | POA: Diagnosis not present

## 2017-05-11 DIAGNOSIS — Z882 Allergy status to sulfonamides status: Secondary | ICD-10-CM

## 2017-05-11 DIAGNOSIS — M25561 Pain in right knee: Secondary | ICD-10-CM | POA: Diagnosis not present

## 2017-05-11 DIAGNOSIS — G459 Transient cerebral ischemic attack, unspecified: Secondary | ICD-10-CM

## 2017-05-11 LAB — CBG MONITORING, ED: Glucose-Capillary: 146 mg/dL — ABNORMAL HIGH (ref 65–99)

## 2017-05-11 NOTE — ED Provider Notes (Addendum)
[facility]-EMER[facility]ENCY DEPT Provider Note   CSN: [id number] Arrival date & time: 05/11/17  2256   LEVEL 5 CAVEAT - ALTERED MENTAL STATUS  History   Chief Complaint Chief Complaint  Patient presents with  . Drooling    HPI Jessica [name] is a 66 y.o. female.  HPI  66 year old female with a history of COPD, anxiety, and recent arthroscopic knee surgery 10 days ago presents with altered mental status and slurred speech. History is taken from the son at the bedside. He states his mom has been acting different for about one week. He states she is both talking out of her head and slurring her words. She is acting abnormal in doing things she normally wouldn't do. He thinks it might be related to the prednisone that she was put on after this knee surgery. She's had a little bit of a cough. No known fevers to him. She was significantly slurring her words when he called about an hour and a half ago to check on her and so he brought her to the ER. While in the waiting room, the patient was getting up and holding onto the wheelchair and she started to buckle her legs and pass out. She then was stiff and shaking all over according to family. This lasted several minutes. On my first exam, the patient is confused although she then progressively improved.  Past Medical History:  Diagnosis Date  . Back pain   . COPD (chronic obstructive pulmonary disease) (HCC)   . Ganglion cyst of wrist   . Mental disorder    anxiety  . Panic attack   . Vertigo     Patient Active Problem List   Diagnosis Date Noted  . Seizure (HCC) 05/12/2017  . Depression with anxiety 05/12/2017  . 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 Vertigo 07/26/2014  . Tinnitus 07/26/2014  . Fracture of coronoid process of left ulna 07/10/2014  . Chest pain 12/27/2013  . Anxiety state 01/21/2007    Past Surgical History:  Procedure Laterality Date  . ABDOMINAL HYSTERECTOMY     partial  . CYSTECTOMY    . NASAL  SINUS SURGERY  1986  . TONSILLECTOMY      OB History    No data available       Home Medications    Prior to Admission medications   Medication Sig Start Date End Date Taking? Authorizing Provider  albuterol (PROVENTIL HFA;VENTOLIN HFA) 108 (90 BASE) MCG/ACT inhaler Inhale 2 puffs into the lungs every 6 (six) hours as needed for wheezing or shortness of breath.    [provider]  diazepam (VALIUM) 5 MG tablet Take 5 mg by mouth 3 (three) times daily.     [provider]  HYDROcodone-acetaminophen (NORCO) 10-325 MG per tablet Take 1 tablet by mouth every 6 (six) hours as needed for moderate pain or severe pain.     [provider]  hydrocortisone (ANUSOL-HC) 2.5 % rectal cream Place 1 application rectally 2 (two) times daily. 09/22/16   Lazaro Arms, MD  pantoprazole (PROTONIX) 40 MG tablet Take 40 mg by mouth daily.    [provider]  polyethylene glycol powder (GLYCOLAX/MIRALAX) powder 1 scoop daily or as needed 09/01/16   Lazaro Arms, MD    Family History No family history on file.  Social History Social History  Substance Use Topics  . Smoking status: Current Every Day Smoker    Packs/day: 1.00    Types: Cigarettes  . Smokeless  tobacco: Current User  . Alcohol use No     Allergies   Bee venom; Fluconazole; Bupropion; Penicillins; Prednisone; Remeron [mirtazapine]; Sulfa antibiotics; and Tetracyclines & related   Review of Systems Review of Systems  Unable to perform ROS: Mental status change     Physical Exam Updated Vital Signs BP 115/73   Pulse 93   Temp 98.2 F (36.8 C) (Rectal)   Resp 18   Ht 5\' 1"  (1.549 m)   Wt 55.3 kg (122 lb)   SpO2 97%   BMI 23.05 kg/m   Physical Exam  Constitutional: She appears well-developed and well-nourished.  HENT:  Head: Normocephalic and atraumatic.  Right Ear: External ear normal.  Left Ear: External ear normal.  Nose: Nose normal.  Eyes: Pupils are equal, round, and  reactive to light. EOM are normal. Right eye exhibits no discharge. Left eye exhibits no discharge.  Neck: Neck supple.  Cardiovascular: Normal rate, regular rhythm and normal heart sounds.   Pulmonary/Chest: Effort normal and breath sounds normal.  Abdominal: Soft. There is no tenderness.  Neurological:  Awake but confused. This progressively improved, and she is becoming more alert. Is alert and oriented when questioned after initial confused period. CN 3-12 grossly intact. 5/5 strength in all 4 extremities. Grossly normal sensation.   Skin: Skin is warm and dry. She is not diaphoretic.  Nursing note and vitals reviewed.    ED Treatments / Results  Labs (all labs ordered are listed, but only abnormal results are displayed) Labs Reviewed  URINALYSIS, ROUTINE W REFLEX MICROSCOPIC - Abnormal; Notable for the following:       Result Value   APPearance HAZY (*)    Hgb urine dipstick SMALL (*)    Leukocytes, UA TRACE (*)    Squamous Epithelial / LPF 6-30 (*)    All other components within normal limits  RAPID URINE DRUG SCREEN, HOSP PERFORMED - Abnormal; Notable for the following:    Opiates POSITIVE (*)    Benzodiazepines POSITIVE (*)    All other components within normal limits  COMPREHENSIVE METABOLIC PANEL - Abnormal; Notable for the following:    Potassium 3.3 (*)    Glucose, Bld 124 (*)    BUN 23 (*)    Creatinine, Ser 1.03 (*)    Total Protein 6.2 (*)    GFR calc non Af Amer 55 (*)    All other components within normal limits  TROPONIN I - Abnormal; Notable for the following:    Troponin I 0.06 (*)    All other components within normal limits  CBC WITH DIFFERENTIAL/PLATELET - Abnormal; Notable for the following:    WBC 11.3 (*)    All other components within normal limits  CBG MONITORING, ED - Abnormal; Notable for the following:    Glucose-Capillary 146 (*)    All other components within normal limits  ETHANOL  PROTIME-INR  MAGNESIUM  TROPONIN I  TROPONIN I    TROPONIN I  TSH    EKG  EKG Interpretation  Date/Time:  Monday May 11 2017 23:42:03 EDT Ventricular Rate:  97 PR Interval:    QRS Duration: 103 QT Interval:  332 QTC Calculation: 422 R Axis:   37 Text Interpretation:  Sinus rhythm RSR' in V1 or V2, right VCD or RVH rate is faster, otherwise is similar to Oct 2016 Confirmed by Pricilla Loveless [phone]) on 05/11/2017 11:47:51 PM       Radiology Dg Chest 1 View  Result Date: 05/12/2017 CLINICAL DATA:  Dyspnea  all day today. Mid to upper chest pain tonight. EXAM: CHEST 1 VIEW COMPARISON:  08/26/2015 FINDINGS: The heart size and mediastinal contours are within normal limits. Both lungs are clear. The visualized skeletal structures are unremarkable. IMPRESSION: No active disease. Electronically Signed   By: Ellery Plunk M.D.   On: 05/12/2017 00:28   Ct Head Wo Contrast  Result Date: 05/12/2017 CLINICAL DATA:  Altered mental status. Slurred speech. Question seizure. EXAM: CT HEAD WITHOUT CONTRAST TECHNIQUE: Contiguous axial images were obtained from the base of the skull through the vertex without intravenous contrast. COMPARISON:  None. FINDINGS: Brain: No evidence of acute infarction, hemorrhage, hydrocephalus, extra-axial collection or mass lesion/mass effect. Brain volume is normal for age. Vascular: Minimal atherosclerosis of skullbase vasculature. No hyperdense vessel. Skull: Normal. Negative for fracture or focal lesion. Sinuses/Orbits: Minimal opacification of lower right mastoid air cells. Visualized orbits are unremarkable. Other: None. IMPRESSION: No acute intracranial abnormality. Electronically Signed   By: Rubye Oaks M.D.   On: 05/12/2017 00:39    Procedures Procedures (including critical care time) CRITICAL CARE Performed by: Pricilla Loveless T   Total critical care time: 30 minutes  Critical care time was exclusive of separately billable procedures and treating other patients.  Critical care was necessary to  treat or prevent imminent or life-threatening deterioration.  Critical care was time spent personally by me on the following activities: development of treatment plan with patient and/or surrogate as well as nursing, discussions with consultants, evaluation of patient's response to treatment, examination of patient, obtaining history from patient or surrogate, ordering and performing treatments and interventions, ordering and review of laboratory studies, ordering and review of radiographic studies, pulse oximetry and re-evaluation of patient's condition.    Medications Ordered in ED Medications  pantoprazole (PROTONIX) EC tablet 40 mg (not administered)  albuterol (PROVENTIL HFA;VENTOLIN HFA) 108 (90 Base) MCG/ACT inhaler 2 puff (not administered)  sodium chloride flush (NS) 0.9 % injection 3 mL (not administered)  dextrose 5 %-0.9 % sodium chloride infusion ( Intravenous New Bag/Given 05/12/17 0359)  levETIRAcetam (KEPPRA XR) 24 hr tablet 500 mg (not administered)  levETIRAcetam (KEPPRA) tablet 500 mg (not administered)  aspirin tablet 325 mg (not administered)  nicotine (NICODERM CQ - dosed in mg/24 hours) patch 14 mg (14 mg Transdermal Patch Applied 05/12/17 0359)  LORazepam (ATIVAN) injection 1 mg (1 mg Intravenous Given 05/12/17 0028)  levETIRAcetam (KEPPRA) IVPB 1000 mg/100 mL premix (0 mg Intravenous Stopped 05/12/17 0054)  sodium chloride 0.9 % bolus 1,000 mL (0 mLs Intravenous Stopped 05/12/17 0315)     Initial Impression / Assessment and Plan / ED Course  I have reviewed the triage vital signs and the nursing notes.  Pertinent labs & imaging results that were available during my care of the patient were reviewed by me and considered in my medical decision making (see chart for details).     When first seen patient, she appears postictal from what was probably a seizure in the waiting room. However she has improved over the next several minutes and is now talking to me normally. She  does not seem altered at this time. She is afebrile. No clear cause for seizures at this time. Given she is not altered, or febrile, I do not think LP is indicated at this time. No clear source of infection. She had another seizure after CT scan and so she was given Ativan and then loaded with Keppra. I consulted neurology, Dr. Otelia Limes, who recommends MRI with and without contrast and  EEG. She will be admitted to the hospitalist for further workup. Her only new medicine is prednisone but this shouldn't cause seizures. She is chronically on Valium and seems to either miss some doses every once a while or take extra. Unclear if this is the cause but I do think benzo withdrawal is the obvious pathology here tonight. No further seizure since being treated.  Final Clinical Impressions(s) / ED Diagnoses   Final diagnoses:  Seizure [facility](HCC)    New Prescriptions New Prescriptions   No medications on file     Pricilla LovelessGoldston, Cru Kritikos, MD 05/12/17 [id number]    Pricilla LovelessGoldston, Trestin Vences, MD 05/12/17 [phone]

## 2017-05-11 NOTE — ED Triage Notes (Signed)
Patient here with family. Reports of drooling and difficulty talking earlier today, patient unable to give specific time. States she was seen at her PCP this morning and had a panic attack. Patient a&ox4. Son in triage states there is something going on with his mother x1 week.

## 2017-05-12 ENCOUNTER — Inpatient Hospital Stay (HOSPITAL_COMMUNITY)
Admit: 2017-05-12 | Discharge: 2017-05-12 | Disposition: A | Discharge status: Home / Self Care | Payer: Medicare HMO | Attending: Internal Medicine | Admitting: Internal Medicine

## 2017-05-12 ENCOUNTER — Emergency Department (HOSPITAL_COMMUNITY): Payer: Medicare HMO

## 2017-05-12 ENCOUNTER — Inpatient Hospital Stay (HOSPITAL_COMMUNITY): Payer: Medicare HMO

## 2017-05-12 ENCOUNTER — Encounter (HOSPITAL_COMMUNITY): Payer: Self-pay | Admitting: *Deleted

## 2017-05-12 DIAGNOSIS — J449 Chronic obstructive pulmonary disease, unspecified: Secondary | ICD-10-CM | POA: Diagnosis present

## 2017-05-12 DIAGNOSIS — Z882 Allergy status to sulfonamides status: Secondary | ICD-10-CM | POA: Diagnosis not present

## 2017-05-12 DIAGNOSIS — Z888 Allergy status to other drugs, medicaments and biological substances status: Secondary | ICD-10-CM | POA: Diagnosis not present

## 2017-05-12 DIAGNOSIS — M7051 Other bursitis of knee, right knee: Secondary | ICD-10-CM | POA: Diagnosis present

## 2017-05-12 DIAGNOSIS — F411 Generalized anxiety disorder: Secondary | ICD-10-CM | POA: Diagnosis not present

## 2017-05-12 DIAGNOSIS — R4182 Altered mental status, unspecified: Secondary | ICD-10-CM | POA: Insufficient documentation

## 2017-05-12 DIAGNOSIS — T380X5A Adverse effect of glucocorticoids and synthetic analogues, initial encounter: Secondary | ICD-10-CM | POA: Diagnosis present

## 2017-05-12 DIAGNOSIS — Z9889 Other specified postprocedural states: Secondary | ICD-10-CM | POA: Diagnosis not present

## 2017-05-12 DIAGNOSIS — F418 Other specified anxiety disorders: Secondary | ICD-10-CM | POA: Diagnosis present

## 2017-05-12 DIAGNOSIS — Z90711 Acquired absence of uterus with remaining cervical stump: Secondary | ICD-10-CM | POA: Diagnosis not present

## 2017-05-12 DIAGNOSIS — Z79899 Other long term (current) drug therapy: Secondary | ICD-10-CM | POA: Diagnosis not present

## 2017-05-12 DIAGNOSIS — R569 Unspecified convulsions: Secondary | ICD-10-CM | POA: Diagnosis present

## 2017-05-12 DIAGNOSIS — F19950 Other psychoactive substance use, unspecified with psychoactive substance-induced psychotic disorder with delusions: Secondary | ICD-10-CM | POA: Diagnosis present

## 2017-05-12 DIAGNOSIS — G40409 Other generalized epilepsy and epileptic syndromes, not intractable, without status epilepticus: Secondary | ICD-10-CM | POA: Diagnosis present

## 2017-05-12 DIAGNOSIS — R4701 Aphasia: Secondary | ICD-10-CM | POA: Diagnosis not present

## 2017-05-12 DIAGNOSIS — F22 Delusional disorders: Secondary | ICD-10-CM | POA: Diagnosis present

## 2017-05-12 DIAGNOSIS — F17210 Nicotine dependence, cigarettes, uncomplicated: Secondary | ICD-10-CM | POA: Diagnosis present

## 2017-05-12 DIAGNOSIS — I361 Nonrheumatic tricuspid (valve) insufficiency: Secondary | ICD-10-CM | POA: Diagnosis not present

## 2017-05-12 DIAGNOSIS — Z880 Allergy status to penicillin: Secondary | ICD-10-CM | POA: Diagnosis not present

## 2017-05-12 DIAGNOSIS — R400 Somnolence: Secondary | ICD-10-CM

## 2017-05-12 LAB — COMPREHENSIVE METABOLIC PANEL
ALT: 17 U/L (ref 14–54)
AST: 19 U/L (ref 15–41)
Albumin: 3.8 g/dL (ref 3.5–5.0)
Alkaline Phosphatase: 67 U/L (ref 38–126)
Anion gap: 10 (ref 5–15)
BUN: 23 mg/dL — AB (ref 6–20)
CHLORIDE: 105 mmol/L (ref 101–111)
CO2: 26 mmol/L (ref 22–32)
CREATININE: 1.03 mg/dL — AB (ref 0.44–1.00)
Calcium: 8.9 mg/dL (ref 8.9–10.3)
GFR calc Af Amer: >60 mL/min (ref >60)
GFR, EST NON AFRICAN AMERICAN: 55 mL/min — AB (ref >60)
Glucose, Bld: 124 mg/dL — ABNORMAL HIGH (ref 65–99)
Potassium: 3.3 mmol/L — ABNORMAL LOW (ref 3.5–5.1)
Sodium: 141 mmol/L (ref 135–145)
Total Bilirubin: 0.5 mg/dL (ref 0.3–1.2)
Total Protein: 6.2 g/dL — ABNORMAL LOW (ref 6.5–8.1)

## 2017-05-12 LAB — URINALYSIS, ROUTINE W REFLEX MICROSCOPIC
BACTERIA UA: NONE SEEN
BILIRUBIN URINE: NEGATIVE
Glucose, UA: NEGATIVE mg/dL
KETONES UR: NEGATIVE mg/dL
Nitrite: NEGATIVE
PH: 6 (ref 5.0–8.0)
Protein, ur: NEGATIVE mg/dL
Specific Gravity, Urine: 1.011 (ref 1.005–1.030)

## 2017-05-12 LAB — BASIC METABOLIC PANEL
Anion gap: 7 (ref 5–15)
BUN: 16 mg/dL (ref 6–20)
CHLORIDE: 109 mmol/L (ref 101–111)
CO2: 24 mmol/L (ref 22–32)
CREATININE: 0.8 mg/dL (ref 0.44–1.00)
Calcium: 8.6 mg/dL — ABNORMAL LOW (ref 8.9–10.3)
GFR calc non Af Amer: >60 mL/min (ref >60)
Glucose, Bld: 83 mg/dL (ref 65–99)
POTASSIUM: 4.1 mmol/L (ref 3.5–5.1)
Sodium: 140 mmol/L (ref 135–145)

## 2017-05-12 LAB — CBC WITH DIFFERENTIAL/PLATELET
BASOS PCT: 0 %
Basophils Absolute: 0 10*3/uL (ref 0.0–0.1)
Eosinophils Absolute: 0.1 10*3/uL (ref 0.0–0.7)
Eosinophils Relative: 1 %
HEMATOCRIT: 37.8 % (ref 36.0–46.0)
Hemoglobin: 12.9 g/dL (ref 12.0–15.0)
LYMPHS ABS: 3 10*3/uL (ref 0.7–4.0)
Lymphocytes Relative: 27 %
MCH: 30.6 pg (ref 26.0–34.0)
MCHC: 34.1 g/dL (ref 30.0–36.0)
MCV: 89.6 fL (ref 78.0–100.0)
MONO ABS: 0.5 10*3/uL (ref 0.1–1.0)
MONOS PCT: 5 %
NEUTROS ABS: 7.6 10*3/uL (ref 1.7–7.7)
Neutrophils Relative %: 67 %
Platelets: 276 10*3/uL (ref 150–400)
RBC: 4.22 MIL/uL (ref 3.87–5.11)
RDW: 13.3 % (ref 11.5–15.5)
WBC: 11.3 10*3/uL — ABNORMAL HIGH (ref 4.0–10.5)

## 2017-05-12 LAB — RAPID URINE DRUG SCREEN, HOSP PERFORMED
Amphetamines: NOT DETECTED
BARBITURATES: NOT DETECTED
Benzodiazepines: POSITIVE — AB
COCAINE: NOT DETECTED
OPIATES: POSITIVE — AB
Tetrahydrocannabinol: NOT DETECTED

## 2017-05-12 LAB — TROPONIN I
Troponin I: 0.06 ng/mL (ref <0.03)
Troponin I: 0.2 ng/mL (ref <0.03)
Troponin I: 0.2 ng/mL (ref <0.03)
Troponin I: 0.11 ng/mL (ref <0.03)
Troponin I: 0.07 ng/mL (ref <0.03)

## 2017-05-12 LAB — TSH: TSH: 1.286 u[IU]/mL (ref 0.350–4.500)

## 2017-05-12 LAB — ETHANOL

## 2017-05-12 LAB — MAGNESIUM: Magnesium: 2.2 mg/dL (ref 1.7–2.4)

## 2017-05-12 LAB — PROTIME-INR
INR: 0.95
Prothrombin Time: 12.7 seconds (ref 11.4–15.2)

## 2017-05-12 MED ORDER — ASPIRIN EC 81 MG PO TBEC
81.0000 mg | DELAYED_RELEASE_TABLET | Freq: Every day | ORAL | Status: DC
  Administered 2017-05-13: 81 mg via ORAL
  Filled 2017-05-12: qty 1

## 2017-05-12 MED ORDER — LORAZEPAM 2 MG/ML IJ SOLN
1.0000 mg | Freq: Once | INTRAMUSCULAR | Status: AC
  Administered 2017-05-12: 1 mg via INTRAVENOUS

## 2017-05-12 MED ORDER — SODIUM CHLORIDE 0.9% FLUSH
3.0000 mL | Freq: Two times a day (BID) | INTRAVENOUS | Status: DC
  Administered 2017-05-12 – 2017-05-13 (×2): 3 mL via INTRAVENOUS

## 2017-05-12 MED ORDER — LEVETIRACETAM 500 MG PO TABS
500.0000 mg | ORAL_TABLET | Freq: Two times a day (BID) | ORAL | Status: DC
  Administered 2017-05-12 – 2017-05-13 (×3): 500 mg via ORAL
  Filled 2017-05-12 (×3): qty 1

## 2017-05-12 MED ORDER — LEVETIRACETAM ER 500 MG PO TB24
500.0000 mg | ORAL_TABLET | Freq: Every day | ORAL | Status: DC
  Filled 2017-05-12 (×2): qty 1

## 2017-05-12 MED ORDER — ALBUTEROL SULFATE HFA 108 (90 BASE) MCG/ACT IN AERS
2.0000 | INHALATION_SPRAY | Freq: Four times a day (QID) | RESPIRATORY_TRACT | Status: DC | PRN
  Filled 2017-05-12: qty 6.7

## 2017-05-12 MED ORDER — PANTOPRAZOLE SODIUM 40 MG PO TBEC
40.0000 mg | DELAYED_RELEASE_TABLET | Freq: Every day | ORAL | Status: DC
  Administered 2017-05-13: 40 mg via ORAL
  Filled 2017-05-12 (×3): qty 1

## 2017-05-12 MED ORDER — ALBUTEROL SULFATE (2.5 MG/3ML) 0.083% IN NEBU: 2.5000 mg | INHALATION_SOLUTION | Freq: Four times a day (QID) | RESPIRATORY_TRACT | Status: DC | PRN

## 2017-05-12 MED ORDER — ASPIRIN 325 MG PO TABS
325.0000 mg | ORAL_TABLET | Freq: Every day | ORAL | Status: DC
  Filled 2017-05-12: qty 1

## 2017-05-12 MED ORDER — DEXTROSE-NACL 5-0.9 % IV SOLN
INTRAVENOUS | Status: DC
  Administered 2017-05-12: 04:00:00 via INTRAVENOUS

## 2017-05-12 MED ORDER — NICOTINE 14 MG/24HR TD PT24
14.0000 mg | MEDICATED_PATCH | Freq: Once | TRANSDERMAL | Status: AC
  Administered 2017-05-12: 14 mg via TRANSDERMAL
  Filled 2017-05-12: qty 1

## 2017-05-12 MED ORDER — LEVETIRACETAM IN NACL 1000 MG/100ML IV SOLN
1000.0000 mg | Freq: Once | INTRAVENOUS | Status: AC
  Administered 2017-05-12: 1000 mg via INTRAVENOUS
  Filled 2017-05-12: qty 100

## 2017-05-12 MED ORDER — HYDROCODONE-ACETAMINOPHEN 10-325 MG PO TABS
1.0000 | ORAL_TABLET | Freq: Four times a day (QID) | ORAL | Status: DC | PRN
  Administered 2017-05-12 – 2017-05-13 (×4): 1 via ORAL
  Filled 2017-05-12 (×4): qty 1

## 2017-05-12 MED ORDER — SODIUM CHLORIDE 0.9 % IV BOLUS (SEPSIS)
1000.0000 mL | Freq: Once | INTRAVENOUS | Status: AC
  Administered 2017-05-12: 1000 mL via INTRAVENOUS

## 2017-05-12 MED ORDER — GADOBENATE DIMEGLUMINE 529 MG/ML IV SOLN
10.0000 mL | Freq: Once | INTRAVENOUS | Status: AC | PRN
  Administered 2017-05-12: 10 mL via INTRAVENOUS

## 2017-05-12 MED ORDER — DIAZEPAM 5 MG PO TABS
5.0000 mg | ORAL_TABLET | Freq: Three times a day (TID) | ORAL | Status: DC
  Administered 2017-05-12 – 2017-05-13 (×4): 5 mg via ORAL
  Filled 2017-05-12 (×4): qty 1

## 2017-05-12 MED ORDER — POTASSIUM CHLORIDE CRYS ER 20 MEQ PO TBCR
40.0000 meq | EXTENDED_RELEASE_TABLET | Freq: Once | ORAL | Status: AC
  Administered 2017-05-12: 40 meq via ORAL
  Filled 2017-05-12: qty 2

## 2017-05-12 MED ORDER — LORAZEPAM 2 MG/ML IJ SOLN
INTRAMUSCULAR | Status: AC
  Filled 2017-05-12: qty 1

## 2017-05-12 NOTE — ED Notes (Signed)
Pt taken to MRI by Shane.  

## 2017-05-12 NOTE — ED Notes (Signed)
Spoke to Dr. Susie CassetteAbrol via telephone to inform her of incident, no new orders at this time, will continue to monitor pt.
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
## 2017-05-12 NOTE — ED Notes (Signed)
Made Scott, pharmacist aware of duplicate medication order for keppra.

## 2017-05-12 NOTE — ED Notes (Signed)
Date and time results received: 05/12/17 0210 (use smartphrase ".now" to insert current time)  Test: trop Critical Value: 0.06 Name of Provider Notified: goldsten  Orders Received? Or Actions Taken?: none at this time

## 2017-05-12 NOTE — H&P (Signed)
History and Physical    Jessica [name] ZOX:[id number] DOB: Dec 15, 1950 DOA: 05/11/2017  PCP: Elfredia Nevins, MD  Patient coming from: Home.    Chief Complaint:  Not acting right after taking the Prednisone.   HPI: Jessica [name] is an 66 y.o. female with hx of right knee arthroscopic surgery, anxiety and depression, severe stress as she lost her daughter thru suicide along with her 5 grand children, hx of panic attack, COPD, noted by family that she was having mood swing, confusion, after taking Prednisone for her right knee bursitis.  Her son had stopped her Prednisone as it was not making her feel well.  As she was seen in the ER, she had 2 brief episodes of generalized tonic clonic seizures.  One of those I witnessed.  She was tightening her arms and feet, seized with eyes blinking, bit her lowe lips, and had short post ictal period.  She was given some IV ativan as well.  She subsequently woke up and I was able to converse with her.  She had no confusion at all, and was able to give me details of her surgery, name of her orthopedics surgeons, including all her medications in perfect details.  She has not been on any ultram, Welbutrin, or any other meds with epileptogenic property.  She has been taking Valium 5mg  TID, and had not ran out of her medication.  She uses no drug and drink no alcohol.  Further work up included a head CT with no acute process, no fever, no leukocytosis, normal CBC and unremarkable serology.  Neurology in [place] was consulted, and recommended admission for new onset of seizure.  She was started on Keppra IV.  Incidentally, her troponin was slightly elevated at 0.06. She had no CP or SOB and had a normal EKG.   ED Course:  See above.  Rewiew of Systems:  Constitutional: Negative for malaise, fever and chills. No significant weight loss or weight gain Eyes: Negative for eye pain, redness and discharge, diplopia, visual changes, or flashes of light. ENMT: Negative for ear  pain, hoarseness, nasal congestion, sinus pressure and sore throat. No headaches; tinnitus, drooling, or problem swallowing. Cardiovascular: Negative for chest pain, palpitations, diaphoresis, dyspnea and peripheral edema. ; No orthopnea, PND Respiratory: Negative for cough, hemoptysis, wheezing and stridor. No pleuritic chestpain. Gastrointestinal: Negative for diarrhea, constipation,  melena, blood in stool, hematemesis, jaundice and rectal bleeding.    Genitourinary: Negative for frequency, dysuria, incontinence,flank pain and hematuria; Musculoskeletal: Negative for back pain and neck pain. Negative for swelling and trauma.;  Skin: . Negative for pruritus, rash, abrasions, bruising and skin lesion.; ulcerations Neuro: Negative for headache, lightheadedness and neck stiffness. Negative for weakness, altered level of consciousness , altered mental status, extremity weakness, burning feet, involuntary movement, seizure and syncope.  Psych: negative for anxiety, depression, insomnia, tearfulness, panic attacks, hallucinations, paranoia, suicidal or homicidal ideation   Past Medical History:  Diagnosis Date  . Back pain   . COPD (chronic obstructive pulmonary disease) (HCC)   . Ganglion cyst of wrist   . Mental disorder    anxiety  . Panic attack   . Vertigo     Past Surgical History:  Procedure Laterality Date  . ABDOMINAL HYSTERECTOMY     partial  . CYSTECTOMY    . NASAL SINUS SURGERY  1986  . TONSILLECTOMY       reports that she has been smoking Cigarettes.  She has been smoking about 1.00 pack per day. She  uses smokeless tobacco. She reports that she does not drink alcohol or use drugs.  Allergies  Allergen Reactions  . Bee Venom Shortness Of Breath and Swelling  . Fluconazole Shortness Of Breath  . Bupropion     Suicidal thoughts  . Penicillins Other (See Comments)    syncope  . Prednisone Other (See Comments)    anxiety  . Remeron [Mirtazapine]   . Sulfa Antibiotics  Other (See Comments)    Incontinence   . Tetracyclines & Related Nausea And Vomiting and Other (See Comments)    Migraine    No family history on file.   Prior to Admission medications   Medication Sig Start Date End Date Taking? Authorizing Provider  albuterol (PROVENTIL HFA;VENTOLIN HFA) 108 (90 BASE) MCG/ACT inhaler Inhale 2 puffs into the lungs every 6 (six) hours as needed for wheezing or shortness of breath.    [provider]  diazepam (VALIUM) 5 MG tablet Take 5 mg by mouth 3 (three) times daily.     [provider]  HYDROcodone-acetaminophen (NORCO) 10-325 MG per tablet Take 1 tablet by mouth every 6 (six) hours as needed for moderate pain or severe pain.     [provider]  hydrocortisone (ANUSOL-HC) 2.5 % rectal cream Place 1 application rectally 2 (two) times daily. 09/22/16   Lazaro Arms, MD  pantoprazole (PROTONIX) 40 MG tablet Take 40 mg by mouth daily.    [provider]  polyethylene glycol powder (GLYCOLAX/MIRALAX) powder 1 scoop daily or as needed 09/01/16   Lazaro Arms, MD    Physical Exam: Vitals:   05/12/17 0228 05/12/17 0230 05/12/17 0300 05/12/17 0330  BP: 104/61 101/63 118/70 115/73  Pulse: 80 75 80 93  Resp:      Temp:      TempSrc:      SpO2: 100% 97% 99% 97%  Weight:      Height:       Constitutional: NAD, calm, comfortable Vitals:   05/12/17 0228 05/12/17 0230 05/12/17 0300 05/12/17 0330  BP: 104/61 101/63 118/70 115/73  Pulse: 80 75 80 93  Resp:      Temp:      TempSrc:      SpO2: 100% 97% 99% 97%  Weight:      Height:       Eyes: PERRL, lids and conjunctivae normal ENMT: Mucous membranes are moist. Posterior pharynx clear of any exudate or lesions.Normal dentition.  Neck: normal, supple, no masses, no thyromegaly Respiratory: clear to auscultation bilaterally, no wheezing, no crackles. Normal respiratory effort. No accessory muscle use.  Cardiovascular: Regular rate and rhythm, no murmurs / rubs /  gallops. No extremity edema. 2+ pedal pulses. No carotid bruits.  Abdomen: no tenderness, no masses palpated. No hepatosplenomegaly. Bowel sounds positive.  Musculoskeletal: no clubbing / cyanosis. No joint deformity upper and lower extremities. Good ROM, no contractures. Normal muscle tone.  Skin: no rashes, lesions, ulcers. No induration Neurologic: CN 2-12 grossly intact. Sensation intact, DTR normal. Strength 5/5 in all 4.  Psychiatric: Normal judgment and insight. Alert and oriented x 3. Normal mood.   Labs on Admission: I have personally reviewed following labs and imaging studies  CBC:  Recent Labs Lab 05/12/17 0104  WBC 11.3*  NEUTROABS 7.6  HGB 12.9  HCT 37.8  MCV 89.6  PLT 276   Basic Metabolic Panel:  Recent Labs Lab 05/12/17 0104  NA 141  K 3.3*  CL 105  CO2 26  GLUCOSE 124*  BUN  23*  CREATININE 1.03*  CALCIUM 8.9  MG 2.2   GFR: Estimated Creatinine Clearance: 40 mL/min (A) (by C-G formula based on SCr of 1.03 mg/dL (H)). Liver Function Tests:  Recent Labs Lab 05/12/17 0104  AST 19  ALT 17  ALKPHOS 67  BILITOT 0.5  PROT 6.2*  ALBUMIN 3.8   Coagulation Profile:  Recent Labs Lab 05/12/17 0104  INR 0.95   Cardiac Enzymes:  Recent Labs Lab 05/12/17 0104  TROPONINI 0.06*   CBG:  Recent Labs Lab 05/11/17 2343  GLUCAP 146*   Urine analysis:    Component Value Date/Time   COLORURINE YELLOW 05/11/2017 2347   APPEARANCEUR HAZY (A) 05/11/2017 2347   LABSPEC 1.011 05/11/2017 2347   PHURINE 6.0 05/11/2017 2347   GLUCOSEU NEGATIVE 05/11/2017 2347   HGBUR SMALL (A) 05/11/2017 2347   BILIRUBINUR NEGATIVE 05/11/2017 2347   KETONESUR NEGATIVE 05/11/2017 2347   PROTEINUR NEGATIVE 05/11/2017 2347   UROBILINOGEN 1.0 08/26/2015 2136   NITRITE NEGATIVE 05/11/2017 2347   LEUKOCYTESUR TRACE (A) 05/11/2017 2347   Radiological Exams on Admission: Dg Chest 1 View  Result Date: 05/12/2017 CLINICAL DATA:  Dyspnea all day today. Mid to upper  chest pain tonight. EXAM: CHEST 1 VIEW COMPARISON:  08/26/2015 FINDINGS: The heart size and mediastinal contours are within normal limits. Both lungs are clear. The visualized skeletal structures are unremarkable. IMPRESSION: No active disease. Electronically Signed   By: Ellery Plunkaniel R Mitchell M.D.   On: 05/12/2017 00:28   Ct Head Wo Contrast  Result Date: 05/12/2017 CLINICAL DATA:  Altered mental status. Slurred speech. Question seizure. EXAM: CT HEAD WITHOUT CONTRAST TECHNIQUE: Contiguous axial images were obtained from the base of the skull through the vertex without intravenous contrast. COMPARISON:  None. FINDINGS: Brain: No evidence of acute infarction, hemorrhage, hydrocephalus, extra-axial collection or mass lesion/mass effect. Brain volume is normal for age. Vascular: Minimal atherosclerosis of skullbase vasculature. No hyperdense vessel. Skull: Normal. Negative for fracture or focal lesion. Sinuses/Orbits: Minimal opacification of lower right mastoid air cells. Visualized orbits are unremarkable. Other: None. IMPRESSION: No acute intracranial abnormality. Electronically Signed   By: Rubye OaksMelanie  Ehinger M.D.   On: 05/12/2017 00:39    EKG: Independently reviewed.  Assessment/Plan Principal Problem:   Seizure (HCC) Active Problems:   Anxiety state   Depression with anxiety   Steroid-induced psychosis, with delusions (HCC)   PLAN:   New onset of seizure:  I suspect she had a true seizure rather than pseudoseizure.  There is no evidence of infection, and this is unlikely to be HSVE.  Will continue with Keppra, obtain MRI with contrast of the brain, and EEG.  Will consult neurology later today.  Place on Seizure precaution, and she is not to drive or operate dangerous machinery.  She has been on Valium, but I don't think this is a benzo withdrawal seizure.  She has been on no epileptogenic medication.  She doesn't drink or uses any drug.   Altered mental status:  This was likely due to Prednisone,  and she has improved with discontinuation of it.   DVT prophylaxis: SCD.  Code Status: FULL CODE.  Family Communication: son at bedside.  Disposition Plan: Home.  Consults called: neurology in [place].  Admission status: inpatient.    Victory Dresden MD FACP. [facility]  If 7PM-7AM, please contact night-coverage [email or web address] Password [email or web address]  05/12/2017, 3:46 AM

## 2017-05-12 NOTE — ED Notes (Addendum)
Laboratory at bedside at this time, called out for a nurse due to pt not being aphasic.  I went in to assess pt and she was stumbling on her words, stuttering with anxiety.  Pt speech is now clear and making complete sentences, she states that she had a panic attack but has never been aphasic. Pt reports that she is now at baseline.  Dr. Susie CassetteAbrol paged by secretary to inform her of this incident.  Pt alert and oriented.
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
## 2017-05-12 NOTE — ED Notes (Signed)
When pt came back from CT she was rigid, shaking slightly and eyes were fluttering, edp notified and orders received.

## 2017-05-12 NOTE — ED Notes (Addendum)
Meal given per Dr. Susie CassetteAbrol.

## 2017-05-12 NOTE — ED Notes (Signed)
EKG given to Dr. Wentz 

## 2017-05-12 NOTE — ED Notes (Signed)
Side rails padded at this time

## 2017-05-12 NOTE — ED Notes (Signed)
Pt provided ice pack for knee.  

## 2017-05-12 NOTE — ED Notes (Signed)
MD Abrol paged and notified of repeat troponin level of 0.20. No new orders at this time.

## 2017-05-12 NOTE — Progress Notes (Signed)
Patient seen and examined  66 y.o. female with hx of right knee arthroscopic surgery, anxiety and depression, severe stress , panic attack, COPD, admitted for 2 brief episodes of generalized tonic clonic seizures.CT with no acute process, no fever, no leukocytosis, normal CBC and unremarkable serology.  Neurology in [place] was consulted, and recommended admission for new onset of seizure.  She was started on Keppra IV. MRI brain without acute intracranial abnormality, old left basal ganglia lacunar infarct.  Incidentally, her troponin was slightly elevated at 0.06>0.20. Ordered 2-D echo to rule out wall motion abnormalities. Started on aspirin 81 mg a day.

## 2017-05-12 NOTE — Progress Notes (Signed)
Called to room by patients family.  Family stated that patient had another spell where she couldn't get her words out and they would like it documented because that's not normal for her.  I agreed that this was not normal and would be documented.  Patient was speaking normally while writer was present in the room.  Will continue to monitor the patient.

## 2017-05-12 NOTE — ED Notes (Signed)
Spoke to Dr. Susie CassetteAbrol, pt requesting pain medication.  Also told that pt could have normal diet.
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
## 2017-05-12 NOTE — Progress Notes (Signed)
EEG Completed; Results Pending  

## 2017-05-12 NOTE — ED Notes (Signed)
Report given to 300 at this time.  

## 2017-05-12 NOTE — ED Notes (Signed)
Date and time results received: 05/12/17 0513 (use smartphrase ".now" to insert current time)  Test: troponin Critical Value: 0.2  Name of Provider Notified: Dr. Conley RollsLe  Orders Received? Or Actions Taken?: Actions Taken: no orders received.

## 2017-05-13 ENCOUNTER — Inpatient Hospital Stay (HOSPITAL_COMMUNITY): Payer: Medicare HMO

## 2017-05-13 DIAGNOSIS — I361 Nonrheumatic tricuspid (valve) insufficiency: Secondary | ICD-10-CM

## 2017-05-13 DIAGNOSIS — R4701 Aphasia: Secondary | ICD-10-CM | POA: Diagnosis not present

## 2017-05-13 DIAGNOSIS — R569 Unspecified convulsions: Secondary | ICD-10-CM

## 2017-05-13 LAB — ECHOCARDIOGRAM COMPLETE
CHL CUP MV DEC (S): 218
CHL CUP RV SYS PRESS: 23 mmHg
CHL CUP TV REG PEAK VELOCITY: 223 cm/s
E decel time: 218 msec
EERAT: 9.04
FS: 41 % (ref 28–44)
Height: 61 in
IVS/LV PW RATIO, ED: 0.93
LA ID, A-P, ES: 32 mm
LA diam index: 2.06 cm/m2
LA vol A4C: 40.7 ml
LA vol index: 24.8 mL/m2
LAVOL: 38.4 mL
LDCA: 2.54 cm2
LEFT ATRIUM END SYS DIAM: 32 mm
LV PW d: 8.67 mm — AB (ref 0.6–1.1)
LV sys vol index: 11 mL/m2
LVDIAVOL: 56 mL (ref 46–106)
LVDIAVOLIN: 36 mL/m2
LVEEAVG: 9.04
LVEEMED: 9.04
LVELAT: 9.25 cm/s
LVOT VTI: 23 cm
LVOT diameter: 18 mm
LVOT peak grad rest: 5 mmHg
LVOTPV: 110 cm/s
LVOTSV: 58 mL
LVSYSVOL: 17 mL (ref 14–42)
MV pk A vel: 59.3 m/s
MV pk E vel: 83.6 m/s
MVPG: 3 mmHg
RV LATERAL S' VELOCITY: 10.7 cm/s
Simpson's disk: 69
Stroke v: 39 ml
TAPSE: 19.2 mm
TDI e' lateral: 9.25
TDI e' medial: 8.81
TR max vel: 223 cm/s
Weight: 1952 oz

## 2017-05-13 MED ORDER — LEVETIRACETAM 500 MG PO TABS: 500.0000 mg | ORAL_TABLET | Freq: Two times a day (BID) | ORAL | 2 refills | Status: AC

## 2017-05-13 MED ORDER — ASPIRIN 81 MG PO TBEC: 81.0000 mg | DELAYED_RELEASE_TABLET | Freq: Every day | ORAL | Status: AC

## 2017-05-13 NOTE — Discharge Summary (Signed)
Physician Discharge Summary  Jessica IhaFrances S Siefker ZOX:[id number] DOB: 08/21/1951 DOA: 05/11/2017  PCP: Elfredia NevinsFusco, Lawrence, MD  Admit date: 05/11/2017 Discharge date: 05/13/2017  Time spent: 45 minutes  Recommendations for Outpatient Follow-up:  -Advised to follow-up with Dr. Gerilyn Pilgrimoonquah in one month as per his recommendations.   Discharge Diagnoses:  Principal Problem:   Seizure [facility](HCC) Active Problems:   Anxiety state   Depression with anxiety   Steroid-induced psychosis, with delusions (HCC)   Discharge Condition: Stable and improved  Filed Weights   05/11/17 2309  Weight: 55.3 kg (122 lb)    History of present illness:  As per Dr. Conley RollsLe on 7/24: Jessica [name] is an 66 y.o. female with hx of right knee arthroscopic surgery, anxiety and depression, severe stress as she lost her daughter thru suicide along with her 5 grand children, hx of panic attack, COPD, noted by family that she was having mood swing, confusion, after taking Prednisone for her right knee bursitis.  Her son had stopped her Prednisone as it was not making her feel well.  As she was seen in the ER, she had 2 brief episodes of generalized tonic clonic seizures.  One of those I witnessed.  She was tightening her arms and feet, seized with eyes blinking, bit her lowe lips, and had short post ictal period.  She was given some IV ativan as well.  She subsequently woke up and I was able to converse with her.  She had no confusion at all, and was able to give me details of her surgery, name of her orthopedics surgeons, including all her medications in perfect details.  She has not been on any ultram, Welbutrin, or any other meds with epileptogenic property.  She has been taking Valium 5mg  TID, and had not ran out of her medication.  She uses no drug and drink no alcohol.  Further work up included a head CT with no acute process, no fever, no leukocytosis, normal CBC and unremarkable serology.  Neurology in [place] was consulted, and  recommended admission for new onset of seizure.  She was started on Keppra IV.  Incidentally, her troponin was slightly elevated at 0.06. She had no CP or SOB and had a normal EKG.   Hospital Course:   New onset seizures -Started on Keppra 500 mg twice daily. -Seen by neurology. -EEG was normal. -Seizure precautions have been discussed with patient specifically no driving or operating heavy machinery, also discussed avoiding heights, swimming and taking baths. -She has also had some recurrent episodes of word finding difficulties. Etiology of this is unclear, however given her ongoing seizures the possibility of a partial seizure is present, MRI of the brain excludes acute CVA, TIA is also a possibility. Patient is to continue aspirin 81 mg daily. Carotid Dopplers show 50-69% stenosis bilaterally.  Procedures:  As above   Consultations:  Neurology  Discharge Instructions  Discharge Instructions    Diet - low sodium heart healthy    Complete by:  As directed    Increase activity slowly    Complete by:  As directed      Allergies as of 05/13/2017      Reactions   Bee Venom Shortness Of Breath, Swelling   Fluconazole Shortness Of Breath   Bupropion    Suicidal thoughts   Penicillins Other (See Comments)   syncope   Prednisone Other (See Comments)   anxiety   Remeron [mirtazapine]    Sulfa Antibiotics Other (See Comments)  Incontinence    Tetracyclines & Related Nausea And Vomiting, Other (See Comments)   Migraine      Medication List    STOP taking these medications   hydrocortisone 2.5 % rectal cream Commonly known as:  ANUSOL-HC     TAKE these medications   albuterol 108 (90 Base) MCG/ACT inhaler Commonly known as:  PROVENTIL HFA;VENTOLIN HFA Inhale 2 puffs into the lungs every 6 (six) hours as needed for wheezing or shortness of breath.   aspirin 81 MG EC tablet Take 1 tablet (81 mg total) by mouth daily.   diazepam 5 MG tablet Commonly known as:   VALIUM Take 5 mg by mouth 3 (three) times daily.   HYDROcodone-acetaminophen 10-325 MG tablet Commonly known as:  NORCO Take 1 tablet by mouth every 6 (six) hours as needed for moderate pain or severe pain.   levETIRAcetam 500 MG tablet Commonly known as:  KEPPRA Take 1 tablet (500 mg total) by mouth 2 (two) times daily.   pantoprazole 40 MG tablet Commonly known as:  PROTONIX Take 40 mg by mouth daily.   polyethylene glycol powder powder Commonly known as:  GLYCOLAX/MIRALAX 1 scoop daily or as needed      Allergies  Allergen Reactions  . Bee Venom Shortness Of Breath and Swelling  . Fluconazole Shortness Of Breath  . Bupropion     Suicidal thoughts  . Penicillins Other (See Comments)    syncope  . Prednisone Other (See Comments)    anxiety  . Remeron [Mirtazapine]   . Sulfa Antibiotics Other (See Comments)    Incontinence   . Tetracyclines & Related Nausea And Vomiting and Other (See Comments)    Migraine   Follow-up Information    Beryle Beams, MD. Schedule an appointment as soon as possible for a visit in 1 month(s).   Specialty:  Neurology Contact information: [address] [place] [address] [phone]            The results of significant diagnostics from this hospitalization (including imaging, microbiology, ancillary and laboratory) are listed below for reference.    Significant Diagnostic Studies: Dg Chest 1 View  Result Date: 05/12/2017 CLINICAL DATA:  Dyspnea all day today. Mid to upper chest pain tonight. EXAM: CHEST 1 VIEW COMPARISON:  08/26/2015 FINDINGS: The heart size and mediastinal contours are within normal limits. Both lungs are clear. The visualized skeletal structures are unremarkable. IMPRESSION: No active disease. Electronically Signed   By: Ellery Plunk M.D.   On: 05/12/2017 00:28   Ct Head Wo Contrast  Result Date: 05/12/2017 CLINICAL DATA:  Altered mental status. Slurred speech. Question seizure. EXAM: CT HEAD  WITHOUT CONTRAST TECHNIQUE: Contiguous axial images were obtained from the base of the skull through the vertex without intravenous contrast. COMPARISON:  None. FINDINGS: Brain: No evidence of acute infarction, hemorrhage, hydrocephalus, extra-axial collection or mass lesion/mass effect. Brain volume is normal for age. Vascular: Minimal atherosclerosis of skullbase vasculature. No hyperdense vessel. Skull: Normal. Negative for fracture or focal lesion. Sinuses/Orbits: Minimal opacification of lower right mastoid air cells. Visualized orbits are unremarkable. Other: None. IMPRESSION: No acute intracranial abnormality. Electronically Signed   By: Rubye Oaks M.D.   On: 05/12/2017 00:39   Mr Laqueta Jean BJ Contrast  Result Date: 05/12/2017 CLINICAL DATA:  New onset seizure.  Altered mental status. EXAM: MRI HEAD WITHOUT AND WITH CONTRAST TECHNIQUE: Multiplanar, multiecho pulse sequences of the brain and surrounding structures were obtained without and with intravenous contrast. CONTRAST:  10mL MULTIHANCE  GADOBENATE DIMEGLUMINE 529 MG/ML IV SOLN COMPARISON:  Head CT 05/12/2017 FINDINGS: Brain: The study is mildly motion degraded, including dedicated thin section imaging through the temporal lobes. The hippocampi are symmetric in size without definite signal abnormality. There is no evidence of acute infarct, intracranial hemorrhage, mass, midline shift, or extra-axial fluid collection. The ventricles and sulci are normal. There is a chronic lacunar infarct in the posterior left lentiform nucleus. Patchy T2 hyperintensities in the corona radiata/centrum semiovale bilaterally are nonspecific but compatible with mild chronic small vessel ischemic disease. No abnormal enhancement is identified. Vascular: Major intracranial vascular flow voids are preserved. Skull and upper cervical spine: Unremarkable bone marrow signal. Sinuses/Orbits: Unremarkable orbits. Small right mastoid effusion. Clear paranasal sinuses. Other:  None. IMPRESSION: 1. Mildly motion degraded examination without acute intracranial abnormality or etiology of seizures identified. 2. Mild chronic small vessel ischemic disease. Chronic left basal ganglia lacunar infarct. Electronically Signed   By: Sebastian Ache M.D.   On: 05/12/2017 08:00   US Carotid Bilateral  Result Date: 05/13/2017 CLINICAL DATA:  Vertigo, altered mental status, dysarthria. History of TIA. History of smoking. EXAM: BILATERAL CAROTID DUPLEX ULTRASOUND TECHNIQUE: Wallace Cullens scale imaging, color Doppler and duplex ultrasound were performed of bilateral carotid and vertebral arteries in the neck. COMPARISON:  None. FINDINGS: Criteria: Quantification of carotid stenosis is based on velocity parameters that correlate the residual internal carotid diameter with NASCET-based stenosis levels, using the diameter of the distal internal carotid lumen as the denominator for stenosis measurement. The following velocity measurements were obtained: RIGHT ICA:  149/46 cm/sec CCA:  116/30 cm/sec SYSTOLIC ICA/CCA RATIO:  1.3 DIASTOLIC ICA/CCA RATIO:  1.5 ECA:  111 cm/sec LEFT ICA:  134/43 cm/sec CCA:  100/28 cm/sec SYSTOLIC ICA/CCA RATIO:  1.3 DIASTOLIC ICA/CCA RATIO:  1.5 ECA:  115 cm/sec RIGHT CAROTID ARTERY: There is a minimal amount of eccentric hypoechoic plaque within the right carotid bulb (image 17), extending to involve the origin and proximal aspects of the right internal carotid artery (image 25), which results in elevated peak systolic velocities within the proximal and mid aspects of the right internal carotid artery (greatest acquired peak systolic velocity with the proximal ICA measures 149 cm/sec - image 27). RIGHT VERTEBRAL ARTERY:  Antegrade Flow LEFT CAROTID ARTERY: There is a minimal amount of intimal thickening / atherosclerotic plaque within the left carotid bulb (image 51), extending to involve the origin and proximal aspects of the left internal carotid artery (image 59), resulting in  borderline elevated peak systolic velocities within the left internal carotid artery (greatest acquired peak systolic velocity with the proximal left ICA measures 134 cm/sec - image 61). LEFT VERTEBRAL ARTERY:  Antegrade Flow IMPRESSION: 1. Minimal amount of right-sided atherosclerotic plaque results in elevated peak systolic velocities within the right internal carotid artery compatible with the 50-69% luminal narrowing range. Further evaluation with CTA could performed as clinically indicated. 2. Minimal amount of intimal thickening / atherosclerotic plaque within the left carotid bulb results in elevated peak systolic velocities within the left internal carotid artery compatible with the lower end of the 50-69% luminal narrowing range. Electronically Signed   By: Simonne Come M.D.   On: 05/13/2017 11:28    Microbiology: No results found for this or any previous visit (from the past 240 hour(s)).   Labs: Basic Metabolic Panel:  Recent Labs Lab 05/12/17 0104 05/12/17 1018  NA 141 140  K 3.3* 4.1  CL 105 109  CO2 26 24  GLUCOSE 124* 83  BUN 23* 16  CREATININE 1.03* 0.80  CALCIUM 8.9 8.6*  MG 2.2  --    Liver Function Tests:  Recent Labs Lab 05/12/17 0104  AST 19  ALT 17  ALKPHOS 67  BILITOT 0.5  PROT 6.2*  ALBUMIN 3.8   No results for input(s): LIPASE, AMYLASE in the last 168 hours. No results for input(s): AMMONIA in the last 168 hours. CBC:  Recent Labs Lab 05/12/17 0104  WBC 11.3*  NEUTROABS 7.6  HGB 12.9  HCT 37.8  MCV 89.6  PLT 276   Cardiac Enzymes:  Recent Labs Lab 05/12/17 0104 05/12/17 0409 05/12/17 1018 05/12/17 1613 05/12/17 2157  TROPONINI 0.06* 0.20* 0.20* 0.11* 0.07*   BNP: BNP (last 3 results) No results for input(s): BNP in the last 8760 hours.  ProBNP (last 3 results) No results for input(s): PROBNP in the last 8760 hours.  CBG:  Recent Labs Lab 05/11/17 2343  GLUCAP 146*       Signed:  Chaya JanHERNANDEZ ACOSTA,ESTELA  [facility] Pager: [phone] 05/13/2017, 2:52 PM

## 2017-05-13 NOTE — Procedures (Signed)
  [facility] Kameela Leipold A. Gerilyn Pilgrimoonquah, MD     [email or web address]           HISTORY: This is a 66 year old who presents with new onset seizures.  MEDICATIONS: Scheduled Meds: . aspirin EC  81 mg Oral Daily  . diazepam  5 mg Oral TID  . levETIRAcetam  500 mg Oral BID  . pantoprazole  40 mg Oral Daily  . sodium chloride flush  3 mL Intravenous Q12H   Continuous Infusions: PRN Meds:.albuterol, HYDROcodone-acetaminophen  Prior to Admission medications   Medication Sig Start Date End Date Taking? Authorizing Provider  albuterol (PROVENTIL HFA;VENTOLIN HFA) 108 (90 BASE) MCG/ACT inhaler Inhale 2 puffs into the lungs every 6 (six) hours as needed for wheezing or shortness of breath.   Yes [provider]  diazepam (VALIUM) 5 MG tablet Take 5 mg by mouth 3 (three) times daily.    Yes [provider]  HYDROcodone-acetaminophen (NORCO) 10-325 MG per tablet Take 1 tablet by mouth every 6 (six) hours as needed for moderate pain or severe pain.    Yes [provider]  hydrocortisone (ANUSOL-HC) 2.5 % rectal cream Place 1 application rectally 2 (two) times daily. Patient not taking: Reported on 05/12/2017 09/22/16   Lazaro ArmsEure, Luther H, MD  pantoprazole (PROTONIX) 40 MG tablet Take 40 mg by mouth daily.    [provider]  polyethylene glycol powder (GLYCOLAX/MIRALAX) powder 1 scoop daily or as needed Patient not taking: Reported on 05/12/2017 09/01/16   Lazaro ArmsEure, Luther H, MD      ANALYSIS: A 16 channel recording using standard 10 20 measurements is conducted for 21 minutes. There is a well-formed posterior dominant rhythm of 9 Hz which attenuates with eye opening. There is beta activity observing the frontal areas. Awake and sleep activities are observed. K complexes and sleep spindles are observed. Photic simulation is carried out without abnormal changes in the background activity. There is no focal or lateral slowing. There is no epileptiform activities  observed.   IMPRESSION: This is a normal recording awake and sleep states.      Lurline Caver A. Gerilyn Pilgrimoonquah, M.D.  Diplomate, Biomedical engineerAmerican Board of Psychiatry and Neurology ( Neurology).

## 2017-05-13 NOTE — Care Management Important Message (Signed)
Important Message  Patient Details  Name: Drexel IhaFrances S Sippel MRN: [id number] Date of Birth: 05/02/1951   Medicare Important Message Given:  Yes    Malcolm MetroChildress, Asuncion Tapscott Demske, RN 05/13/2017, 3:42 PM
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
## 2017-05-13 NOTE — Progress Notes (Signed)
Patient IV removed, telemetry removed, patient tolerated well. Discharge instructions given at bedside with family present.

## 2017-05-13 NOTE — Progress Notes (Signed)
*  PRELIMINARY RESULTS* Echocardiogram 2D Echocardiogram has been performed.  Stacey DrainWhite, Shawnice Tilmon J 05/13/2017, 2:47 PM

## 2017-05-13 NOTE — Care Management Note (Signed)
Case Management Note  Patient Details  Name: Jessica [name] MRN: [id number] Date of Birth: 09/16/1951  Subjective/Objective:                  Pt admitted with seizures. Pt is from home, she is ind with ADL's. She uses cane with ambulation. She has PCP, transportation and insurance with drug coverage. She plans to return home with self care. Pt communicates no needs.   Action/Plan: Discharging home today with self care.   Expected Discharge Date:  05/13/17               Expected Discharge Plan:  Home/Self Care  In-House Referral:  NA  Discharge planning Services  CM Consult  Post Acute Care Choice:  NA Choice offered to:  NA  Status of Service:  Completed, signed off  Malcolm MetroChildress, Amarianna Abplanalp Demske, RN 05/13/2017, 3:41 PM

## 2017-05-26 DIAGNOSIS — Z6822 Body mass index (BMI) 22.0-22.9, adult: Secondary | ICD-10-CM | POA: Diagnosis not present

## 2017-05-26 DIAGNOSIS — F1120 Opioid dependence, uncomplicated: Secondary | ICD-10-CM | POA: Diagnosis not present

## 2017-05-26 DIAGNOSIS — T50905A Adverse effect of unspecified drugs, medicaments and biological substances, initial encounter: Secondary | ICD-10-CM | POA: Diagnosis not present

## 2017-05-26 DIAGNOSIS — Z1389 Encounter for screening for other disorder: Secondary | ICD-10-CM | POA: Diagnosis not present

## 2017-06-08 DIAGNOSIS — R441 Visual hallucinations: Secondary | ICD-10-CM | POA: Diagnosis not present

## 2017-06-08 DIAGNOSIS — R4701 Aphasia: Secondary | ICD-10-CM | POA: Diagnosis not present

## 2017-06-08 DIAGNOSIS — M130 Polyarthritis, unspecified: Secondary | ICD-10-CM | POA: Diagnosis not present

## 2017-06-08 DIAGNOSIS — R413 Other amnesia: Secondary | ICD-10-CM | POA: Diagnosis not present

## 2017-06-08 DIAGNOSIS — Z79899 Other long term (current) drug therapy: Secondary | ICD-10-CM | POA: Diagnosis not present

## 2017-06-08 DIAGNOSIS — R569 Unspecified convulsions: Secondary | ICD-10-CM | POA: Diagnosis not present

## 2017-06-09 DIAGNOSIS — S83241D Other tear of medial meniscus, current injury, right knee, subsequent encounter: Secondary | ICD-10-CM | POA: Diagnosis not present

## 2017-06-09 DIAGNOSIS — S83281D Other tear of lateral meniscus, current injury, right knee, subsequent encounter: Secondary | ICD-10-CM | POA: Diagnosis not present

## 2017-06-12 DIAGNOSIS — G40909 Epilepsy, unspecified, not intractable, without status epilepticus: Secondary | ICD-10-CM | POA: Diagnosis not present

## 2017-06-12 DIAGNOSIS — Z6822 Body mass index (BMI) 22.0-22.9, adult: Secondary | ICD-10-CM | POA: Diagnosis not present

## 2017-06-12 DIAGNOSIS — R569 Unspecified convulsions: Secondary | ICD-10-CM | POA: Diagnosis not present

## 2017-06-12 DIAGNOSIS — G894 Chronic pain syndrome: Secondary | ICD-10-CM | POA: Diagnosis not present

## 2017-06-12 DIAGNOSIS — Z79891 Long term (current) use of opiate analgesic: Secondary | ICD-10-CM | POA: Diagnosis not present

## 2017-06-12 DIAGNOSIS — F1120 Opioid dependence, uncomplicated: Secondary | ICD-10-CM | POA: Diagnosis not present

## 2017-06-12 DIAGNOSIS — Z1389 Encounter for screening for other disorder: Secondary | ICD-10-CM | POA: Diagnosis not present

## 2017-06-18 DIAGNOSIS — S83281D Other tear of lateral meniscus, current injury, right knee, subsequent encounter: Secondary | ICD-10-CM | POA: Diagnosis not present

## 2017-06-18 DIAGNOSIS — S83241D Other tear of medial meniscus, current injury, right knee, subsequent encounter: Secondary | ICD-10-CM | POA: Diagnosis not present

## 2017-06-23 ENCOUNTER — Encounter: Payer: Self-pay | Admitting: Gastroenterology

## 2017-06-26 ENCOUNTER — Encounter: Payer: Self-pay | Admitting: Gastroenterology

## 2017-07-03 DIAGNOSIS — M25561 Pain in right knee: Secondary | ICD-10-CM | POA: Diagnosis not present

## 2017-07-07 DIAGNOSIS — S83281D Other tear of lateral meniscus, current injury, right knee, subsequent encounter: Secondary | ICD-10-CM | POA: Diagnosis not present

## 2017-07-07 DIAGNOSIS — S83241D Other tear of medial meniscus, current injury, right knee, subsequent encounter: Secondary | ICD-10-CM | POA: Diagnosis not present

## 2017-07-20 DIAGNOSIS — M25551 Pain in right hip: Secondary | ICD-10-CM | POA: Diagnosis not present

## 2017-07-20 DIAGNOSIS — M545 Low back pain: Secondary | ICD-10-CM | POA: Diagnosis not present

## 2017-07-24 DIAGNOSIS — M545 Low back pain: Secondary | ICD-10-CM | POA: Diagnosis not present

## 2017-08-07 ENCOUNTER — Ambulatory Visit: Payer: Medicare HMO | Admitting: Gastroenterology

## 2017-08-13 ENCOUNTER — Ambulatory Visit: Payer: Medicare HMO | Admitting: Gastroenterology

## 2017-08-19 DIAGNOSIS — Z1389 Encounter for screening for other disorder: Secondary | ICD-10-CM | POA: Diagnosis not present

## 2017-08-19 DIAGNOSIS — F419 Anxiety disorder, unspecified: Secondary | ICD-10-CM | POA: Diagnosis not present

## 2017-08-19 DIAGNOSIS — M1991 Primary osteoarthritis, unspecified site: Secondary | ICD-10-CM | POA: Diagnosis not present

## 2017-08-19 DIAGNOSIS — Z6821 Body mass index (BMI) 21.0-21.9, adult: Secondary | ICD-10-CM | POA: Diagnosis not present

## 2017-08-19 DIAGNOSIS — G894 Chronic pain syndrome: Secondary | ICD-10-CM | POA: Diagnosis not present

## 2017-08-28 ENCOUNTER — Encounter: Payer: Self-pay | Admitting: Orthopedic Surgery

## 2017-09-14 ENCOUNTER — Ambulatory Visit: Payer: Medicare HMO | Admitting: Orthopedic Surgery

## 2017-09-22 ENCOUNTER — Ambulatory Visit: Payer: Medicare HMO | Admitting: Orthopedic Surgery

## 2017-09-22 ENCOUNTER — Encounter: Payer: Self-pay | Admitting: Orthopedic Surgery

## 2017-09-22 VITALS — BP 126/62 | HR 70 | Ht 61.03 in | Wt 113.0 lb

## 2017-09-22 DIAGNOSIS — M1711 Unilateral primary osteoarthritis, right knee: Secondary | ICD-10-CM | POA: Diagnosis not present

## 2017-09-22 DIAGNOSIS — M5137 Other intervertebral disc degeneration, lumbosacral region: Secondary | ICD-10-CM | POA: Diagnosis not present

## 2017-09-22 DIAGNOSIS — Z9889 Other specified postprocedural states: Secondary | ICD-10-CM | POA: Diagnosis not present

## 2017-09-22 DIAGNOSIS — M5417 Radiculopathy, lumbosacral region: Secondary | ICD-10-CM | POA: Diagnosis not present

## 2017-09-22 NOTE — Patient Instructions (Signed)
Steps to Quit Smoking Smoking tobacco can be bad for your health. It can also affect almost every organ in your body. Smoking puts you and people around you at risk for many serious long-lasting (chronic) diseases. Quitting smoking is hard, but it is one of the best things that you can do for your health. It is never too late to quit. What are the benefits of quitting smoking? When you quit smoking, you lower your risk for getting serious diseases and conditions. They can include:  Lung cancer or lung disease.  Heart disease.  Stroke.  Heart attack.  Not being able to have children (infertility).  Weak bones (osteoporosis) and broken bones (fractures).  If you have coughing, wheezing, and shortness of breath, those symptoms may get better when you quit. You may also get sick less often. If you are pregnant, quitting smoking can help to lower your chances of having a baby of low birth weight. What can I do to help me quit smoking? Talk with your doctor about what can help you quit smoking. Some things you can do (strategies) include:  Quitting smoking totally, instead of slowly cutting back how much you smoke over a period of time.  Going to in-person counseling. You are more likely to quit if you go to many counseling sessions.  Using resources and support systems, such as: ? Online chats with a counselor. ? Phone quitlines. ? Printed self-help materials. ? Support groups or group counseling. ? Text messaging programs. ? Mobile phone apps or applications.  Taking medicines. Some of these medicines may have nicotine in them. If you are pregnant or breastfeeding, do not take any medicines to quit smoking unless your doctor says it is okay. Talk with your doctor about counseling or other things that can help you.  Talk with your doctor about using more than one strategy at the same time, such as taking medicines while you are also going to in-person counseling. This can help make  quitting easier. What things can I do to make it easier to quit? Quitting smoking might feel very hard at first, but there is a lot that you can do to make it easier. Take these steps:  Talk to your family and friends. Ask them to support and encourage you.  Call phone quitlines, reach out to support groups, or work with a counselor.  Ask people who smoke to not smoke around you.  Avoid places that make you want (trigger) to smoke, such as: ? Bars. ? Parties. ? Smoke-break areas at work.  Spend time with people who do not smoke.  Lower the stress in your life. Stress can make you want to smoke. Try these things to help your stress: ? Getting regular exercise. ? Deep-breathing exercises. ? Yoga. ? Meditating. ? Doing a body scan. To do this, close your eyes, focus on one area of your body at a time from head to toe, and notice which parts of your body are tense. Try to relax the muscles in those areas.  Download or buy apps on your mobile phone or tablet that can help you stick to your quit plan. There are many free apps, such as QuitGuide from the CDC (Centers for Disease Control and Prevention). You can find more support from smokefree.gov and other websites.  This information is not intended to replace advice given to you by your health care provider. Make sure you discuss any questions you have with your health care provider. Document Released: 08/02/2009 Document   Revised: 06/03/2016 Document Reviewed: 02/20/2015 Elsevier Interactive Patient Education  2018 Elsevier Inc.  

## 2017-09-22 NOTE — Progress Notes (Signed)
NEW PATIENT OFFICE VISIT    Chief Complaint  Patient presents with  . Knee Pain    right, states pain since surgery on knee June 14th     Second opinion provided.  The patient gives a history of arthroscopic knee surgery in March 2018 for knee pain.  She was found to have a torn medial lateral meniscus and chondromalacia medial lateral compartment.  She says she never got better.  They did try prednisone Dosepak which she seems to have gotten some type of steroid psychosis.  They tried diclofenac with no success.  She says she had some type of lubrication injected into the knee which did not work.  She presents now on a cane with persistent severe burning pain in her right knee.  (Location of pain right knee, quality burning, severity moderate, duration 8 months, timing constant, associated symptoms swelling right knee)    Review of Systems  Respiratory: Negative for shortness of breath.   Cardiovascular: Negative for chest pain.  Musculoskeletal: Positive for back pain and joint pain.  Neurological: Positive for sensory change.  All other systems reviewed and are negative.    Past Medical History:  Diagnosis Date  . Back pain   . COPD (chronic obstructive pulmonary disease) ([facility])   . Ganglion cyst of wrist   . Mental disorder    anxiety  . Panic attack   . Vertigo     Past Surgical History:  Procedure Laterality Date  . ABDOMINAL HYSTERECTOMY     partial  . CYSTECTOMY    . NASAL SINUS SURGERY  1986  . TONSILLECTOMY      History reviewed. No pertinent family history. Social History   Tobacco Use  . Smoking status: Current Every Day Smoker    Packs/day: 1.00    Types: Cigarettes  . Smokeless tobacco: Current User  Substance Use Topics  . Alcohol use: No  . Drug use: No   Allergies  Allergen Reactions  . Bee Venom Shortness Of Breath and Swelling  . Fluconazole Shortness Of Breath  . Prednisone   . Bupropion     Suicidal thoughts  . Penicillins Other  (See Comments)    syncope  . Remeron [Mirtazapine]   . Sulfa Antibiotics Other (See Comments)    Incontinence   . Tetracyclines & Related Nausea And Vomiting and Other (See Comments)    Migraine    Current Meds  Medication Sig  . aspirin EC 81 MG EC tablet Take 1 tablet (81 mg total) by mouth daily.  . diazepam (VALIUM) 5 MG tablet Take 5 mg by mouth 3 (three) times daily.   Marland Kitchen. HYDROcodone-acetaminophen (NORCO) 10-325 MG per tablet Take 1 tablet by mouth every 6 (six) hours as needed for moderate pain or severe pain.   . traZODone (DESYREL) 100 MG tablet Take 100 mg by mouth at bedtime.    BP 126/62   Pulse 70   Ht 5' 1.03" (1.55 m)   Wt 113 lb (51.3 kg)   BMI 21.33 kg/m   Physical Exam  Constitutional: She is oriented to person, place, and time. She appears well-developed and well-nourished.  Normal body habitus ectomorphic no gross deformity  Cardiovascular:  Pulses:      Dorsalis pedis pulses are 2+ on the right side, and 2+ on the left side.       Posterior tibial pulses are 2+ on the right side, and 2+ on the left side.  Lymphadenopathy:       Right:  No inguinal adenopathy present.       Left: No inguinal adenopathy present.  Neurological: She is alert and oriented to person, place, and time. She displays no atrophy and no tremor. No sensory deficit. She exhibits normal muscle tone. Gait abnormal. Coordination normal. She displays no Babinski's sign on the right side. She displays no Babinski's sign on the left side.  Reflex Scores:      Patellar reflexes are 2+ on the right side and 2+ on the left side.      Achilles reflexes are 2+ on the right side and 2+ on the left side. Patient's gait is supported by cane  Psychiatric: She has a normal mood and affect. Her behavior is normal. Judgment and thought content normal.    Ortho Exam   Neck and back on inspection there is tenderness in the upper thoracic region none in the neck there is also tenderness in the midline of  the lower back as well as the right lower back.  Cervical and lumbar range of motion show no gross deficits.  There is no instability in either area.  Muscle tone is normal in each area and the skin is normal as well.  Left lower extremity inspection reveals no palpable effusion in the knee full range of motion and stability were confirmed by testing strength deficits none skin intact.  Right knee small effusion tenderness on the medial lateral aspects of the patellar tendon with flexion of 110 degrees measured in full extension and it should be noted that the patient held her right leg and external rotation.  Cruciate ligaments collateral ligaments were stable muscle tone was excellent skin was warm dry and intact  Hip motion in each leg was normal as well.  We noticed that the sensation on the dorsum of the left foot compared to the right was abnormal with the pinwheel test.  She also had decreased sensation in the L5 root distribution and L4 near the knee.   Imaging.  And notes.  Several office visit notes and operative report from Dr.Weiner reviewed and can be summed as follows.  The patient had arthroscopy of her right knee for torn medial and lateral meniscus partial meniscectomies and she was found to have grade I-II chondromalacia of the medial and lateral joints with normal cruciate ligaments.  Lumbar spine MRI showed circumferential disc bulge at L3 and 4 and facet arthritis between L2 through S1 without any compressive stenosis.     Encounter Diagnoses  Name Primary?  . S/P right knee arthroscopy Yes  . Primary osteoarthritis of right knee   . DDD (degenerative disc disease), lumbosacral   . Lumbosacral radiculopathy at L4    Recommend from the second opinion, the following.  #1 physical therapy lumbar spine #2 L4 diagnostic root block.  If the patient's pain is resolved recommend CT myelogram lumbar spine

## 2017-11-10 DIAGNOSIS — J449 Chronic obstructive pulmonary disease, unspecified: Secondary | ICD-10-CM | POA: Diagnosis not present

## 2017-11-10 DIAGNOSIS — Z6822 Body mass index (BMI) 22.0-22.9, adult: Secondary | ICD-10-CM | POA: Diagnosis not present

## 2017-11-10 DIAGNOSIS — G894 Chronic pain syndrome: Secondary | ICD-10-CM | POA: Diagnosis not present

## 2017-11-10 DIAGNOSIS — Z1389 Encounter for screening for other disorder: Secondary | ICD-10-CM | POA: Diagnosis not present

## 2017-11-10 DIAGNOSIS — F419 Anxiety disorder, unspecified: Secondary | ICD-10-CM | POA: Diagnosis not present

## 2017-11-10 DIAGNOSIS — R569 Unspecified convulsions: Secondary | ICD-10-CM | POA: Diagnosis not present

## 2018-01-07 DIAGNOSIS — Z1389 Encounter for screening for other disorder: Secondary | ICD-10-CM | POA: Diagnosis not present

## 2018-01-07 DIAGNOSIS — Z0000 Encounter for general adult medical examination without abnormal findings: Secondary | ICD-10-CM | POA: Diagnosis not present

## 2018-02-15 DIAGNOSIS — M1991 Primary osteoarthritis, unspecified site: Secondary | ICD-10-CM | POA: Diagnosis not present

## 2018-02-15 DIAGNOSIS — F1120 Opioid dependence, uncomplicated: Secondary | ICD-10-CM | POA: Diagnosis not present

## 2018-02-15 DIAGNOSIS — F419 Anxiety disorder, unspecified: Secondary | ICD-10-CM | POA: Diagnosis not present

## 2018-02-15 DIAGNOSIS — Z6822 Body mass index (BMI) 22.0-22.9, adult: Secondary | ICD-10-CM | POA: Diagnosis not present

## 2018-02-15 DIAGNOSIS — M779 Enthesopathy, unspecified: Secondary | ICD-10-CM | POA: Diagnosis not present

## 2018-02-15 DIAGNOSIS — Z1389 Encounter for screening for other disorder: Secondary | ICD-10-CM | POA: Diagnosis not present

## 2018-02-15 DIAGNOSIS — G894 Chronic pain syndrome: Secondary | ICD-10-CM | POA: Diagnosis not present

## 2018-03-16 DIAGNOSIS — H539 Unspecified visual disturbance: Secondary | ICD-10-CM | POA: Diagnosis not present

## 2018-03-16 DIAGNOSIS — Z1389 Encounter for screening for other disorder: Secondary | ICD-10-CM | POA: Diagnosis not present

## 2018-03-16 DIAGNOSIS — G894 Chronic pain syndrome: Secondary | ICD-10-CM | POA: Diagnosis not present

## 2018-03-16 DIAGNOSIS — M1991 Primary osteoarthritis, unspecified site: Secondary | ICD-10-CM | POA: Diagnosis not present

## 2018-03-16 DIAGNOSIS — Z6822 Body mass index (BMI) 22.0-22.9, adult: Secondary | ICD-10-CM | POA: Diagnosis not present

## 2018-03-16 DIAGNOSIS — R300 Dysuria: Secondary | ICD-10-CM | POA: Diagnosis not present

## 2018-03-24 ENCOUNTER — Other Ambulatory Visit (HOSPITAL_COMMUNITY): Payer: Self-pay | Admitting: Family Medicine

## 2018-03-24 DIAGNOSIS — Z1231 Encounter for screening mammogram for malignant neoplasm of breast: Secondary | ICD-10-CM

## 2018-04-06 DIAGNOSIS — H524 Presbyopia: Secondary | ICD-10-CM | POA: Diagnosis not present

## 2018-05-10 ENCOUNTER — Ambulatory Visit (HOSPITAL_COMMUNITY)
Admission: RE | Admit: 2018-05-10 | Discharge: 2018-05-10 | Disposition: A | Discharge status: Home / Self Care | Payer: Medicare HMO | Source: Ambulatory Visit | Attending: Family Medicine | Admitting: Family Medicine

## 2018-05-10 ENCOUNTER — Encounter (HOSPITAL_COMMUNITY): Payer: Self-pay

## 2018-05-10 DIAGNOSIS — Z1231 Encounter for screening mammogram for malignant neoplasm of breast: Secondary | ICD-10-CM | POA: Diagnosis not present

## 2018-05-11 ENCOUNTER — Other Ambulatory Visit (HOSPITAL_COMMUNITY): Payer: Self-pay | Admitting: Family Medicine

## 2018-05-11 DIAGNOSIS — R928 Other abnormal and inconclusive findings on diagnostic imaging of breast: Secondary | ICD-10-CM

## 2018-05-18 ENCOUNTER — Ambulatory Visit (HOSPITAL_COMMUNITY)
Admission: RE | Admit: 2018-05-18 | Discharge: 2018-05-18 | Disposition: A | Discharge status: Home / Self Care | Payer: Medicare HMO | Source: Ambulatory Visit | Attending: Family Medicine | Admitting: Family Medicine

## 2018-05-18 DIAGNOSIS — R928 Other abnormal and inconclusive findings on diagnostic imaging of breast: Secondary | ICD-10-CM | POA: Insufficient documentation

## 2018-05-19 DIAGNOSIS — Z0001 Encounter for general adult medical examination with abnormal findings: Secondary | ICD-10-CM | POA: Diagnosis not present

## 2018-05-19 DIAGNOSIS — F329 Major depressive disorder, single episode, unspecified: Secondary | ICD-10-CM | POA: Diagnosis not present

## 2018-05-19 DIAGNOSIS — M1991 Primary osteoarthritis, unspecified site: Secondary | ICD-10-CM | POA: Diagnosis not present

## 2018-05-19 DIAGNOSIS — M5410 Radiculopathy, site unspecified: Secondary | ICD-10-CM | POA: Diagnosis not present

## 2018-05-19 DIAGNOSIS — M5417 Radiculopathy, lumbosacral region: Secondary | ICD-10-CM | POA: Diagnosis not present

## 2018-05-19 DIAGNOSIS — M47816 Spondylosis without myelopathy or radiculopathy, lumbar region: Secondary | ICD-10-CM | POA: Diagnosis not present

## 2018-05-19 DIAGNOSIS — M545 Low back pain: Secondary | ICD-10-CM | POA: Diagnosis not present

## 2018-05-19 DIAGNOSIS — Z6822 Body mass index (BMI) 22.0-22.9, adult: Secondary | ICD-10-CM | POA: Diagnosis not present

## 2018-08-12 DIAGNOSIS — Z1211 Encounter for screening for malignant neoplasm of colon: Secondary | ICD-10-CM | POA: Diagnosis not present

## 2018-08-13 DIAGNOSIS — M1991 Primary osteoarthritis, unspecified site: Secondary | ICD-10-CM | POA: Diagnosis not present

## 2018-08-13 DIAGNOSIS — G894 Chronic pain syndrome: Secondary | ICD-10-CM | POA: Diagnosis not present

## 2018-08-13 DIAGNOSIS — J449 Chronic obstructive pulmonary disease, unspecified: Secondary | ICD-10-CM | POA: Diagnosis not present

## 2018-08-13 DIAGNOSIS — Z6821 Body mass index (BMI) 21.0-21.9, adult: Secondary | ICD-10-CM | POA: Diagnosis not present

## 2018-08-13 DIAGNOSIS — F419 Anxiety disorder, unspecified: Secondary | ICD-10-CM | POA: Diagnosis not present

## 2018-11-04 DIAGNOSIS — Z6822 Body mass index (BMI) 22.0-22.9, adult: Secondary | ICD-10-CM | POA: Diagnosis not present

## 2018-11-04 DIAGNOSIS — R569 Unspecified convulsions: Secondary | ICD-10-CM | POA: Diagnosis not present

## 2018-11-04 DIAGNOSIS — F19950 Other psychoactive substance use, unspecified with psychoactive substance-induced psychotic disorder with delusions: Secondary | ICD-10-CM | POA: Diagnosis not present

## 2018-11-04 DIAGNOSIS — Z1389 Encounter for screening for other disorder: Secondary | ICD-10-CM | POA: Diagnosis not present

## 2018-11-04 DIAGNOSIS — Z0001 Encounter for general adult medical examination with abnormal findings: Secondary | ICD-10-CM | POA: Diagnosis not present

## 2018-11-04 DIAGNOSIS — G894 Chronic pain syndrome: Secondary | ICD-10-CM | POA: Diagnosis not present

## 2018-11-04 DIAGNOSIS — J329 Chronic sinusitis, unspecified: Secondary | ICD-10-CM | POA: Diagnosis not present

## 2018-11-04 DIAGNOSIS — K219 Gastro-esophageal reflux disease without esophagitis: Secondary | ICD-10-CM | POA: Diagnosis not present

## 2018-11-04 DIAGNOSIS — F329 Major depressive disorder, single episode, unspecified: Secondary | ICD-10-CM | POA: Diagnosis not present

## 2018-11-04 DIAGNOSIS — R42 Dizziness and giddiness: Secondary | ICD-10-CM | POA: Diagnosis not present

## 2018-12-14 DIAGNOSIS — G894 Chronic pain syndrome: Secondary | ICD-10-CM | POA: Diagnosis not present

## 2018-12-14 DIAGNOSIS — R569 Unspecified convulsions: Secondary | ICD-10-CM | POA: Diagnosis not present

## 2018-12-14 DIAGNOSIS — Z6822 Body mass index (BMI) 22.0-22.9, adult: Secondary | ICD-10-CM | POA: Diagnosis not present

## 2018-12-14 DIAGNOSIS — F19950 Other psychoactive substance use, unspecified with psychoactive substance-induced psychotic disorder with delusions: Secondary | ICD-10-CM | POA: Diagnosis not present

## 2018-12-14 DIAGNOSIS — J449 Chronic obstructive pulmonary disease, unspecified: Secondary | ICD-10-CM | POA: Diagnosis not present

## 2018-12-14 DIAGNOSIS — F1120 Opioid dependence, uncomplicated: Secondary | ICD-10-CM | POA: Diagnosis not present

## 2019-01-04 DIAGNOSIS — G894 Chronic pain syndrome: Secondary | ICD-10-CM | POA: Diagnosis not present

## 2019-01-04 DIAGNOSIS — G2581 Restless legs syndrome: Secondary | ICD-10-CM | POA: Diagnosis not present

## 2019-01-04 DIAGNOSIS — Z6821 Body mass index (BMI) 21.0-21.9, adult: Secondary | ICD-10-CM | POA: Diagnosis not present

## 2019-01-21 DIAGNOSIS — F1120 Opioid dependence, uncomplicated: Secondary | ICD-10-CM | POA: Diagnosis not present

## 2019-01-21 DIAGNOSIS — J309 Allergic rhinitis, unspecified: Secondary | ICD-10-CM | POA: Diagnosis not present

## 2019-01-21 DIAGNOSIS — J329 Chronic sinusitis, unspecified: Secondary | ICD-10-CM | POA: Diagnosis not present

## 2019-01-21 DIAGNOSIS — G894 Chronic pain syndrome: Secondary | ICD-10-CM | POA: Diagnosis not present

## 2019-01-21 DIAGNOSIS — Z6822 Body mass index (BMI) 22.0-22.9, adult: Secondary | ICD-10-CM | POA: Diagnosis not present

## 2019-03-08 DIAGNOSIS — Z1389 Encounter for screening for other disorder: Secondary | ICD-10-CM | POA: Diagnosis not present

## 2019-03-08 DIAGNOSIS — Z6821 Body mass index (BMI) 21.0-21.9, adult: Secondary | ICD-10-CM | POA: Diagnosis not present

## 2019-03-08 DIAGNOSIS — J329 Chronic sinusitis, unspecified: Secondary | ICD-10-CM | POA: Diagnosis not present

## 2019-03-08 DIAGNOSIS — G894 Chronic pain syndrome: Secondary | ICD-10-CM | POA: Diagnosis not present

## 2019-04-07 DIAGNOSIS — G894 Chronic pain syndrome: Secondary | ICD-10-CM | POA: Diagnosis not present

## 2019-04-07 DIAGNOSIS — M1991 Primary osteoarthritis, unspecified site: Secondary | ICD-10-CM | POA: Diagnosis not present

## 2019-05-11 DIAGNOSIS — J449 Chronic obstructive pulmonary disease, unspecified: Secondary | ICD-10-CM | POA: Diagnosis not present

## 2019-05-11 DIAGNOSIS — G894 Chronic pain syndrome: Secondary | ICD-10-CM | POA: Diagnosis not present

## 2019-05-11 DIAGNOSIS — K219 Gastro-esophageal reflux disease without esophagitis: Secondary | ICD-10-CM | POA: Diagnosis not present

## 2019-05-11 DIAGNOSIS — G2581 Restless legs syndrome: Secondary | ICD-10-CM | POA: Diagnosis not present

## 2019-06-09 DIAGNOSIS — M1991 Primary osteoarthritis, unspecified site: Secondary | ICD-10-CM | POA: Diagnosis not present

## 2019-06-09 DIAGNOSIS — J329 Chronic sinusitis, unspecified: Secondary | ICD-10-CM | POA: Diagnosis not present

## 2019-06-09 DIAGNOSIS — G894 Chronic pain syndrome: Secondary | ICD-10-CM | POA: Diagnosis not present

## 2019-06-13 ENCOUNTER — Other Ambulatory Visit (HOSPITAL_COMMUNITY): Payer: Self-pay | Admitting: Physician Assistant

## 2019-06-13 ENCOUNTER — Other Ambulatory Visit: Payer: Self-pay | Admitting: Physician Assistant

## 2019-06-13 DIAGNOSIS — J329 Chronic sinusitis, unspecified: Secondary | ICD-10-CM

## 2019-06-17 DIAGNOSIS — H524 Presbyopia: Secondary | ICD-10-CM | POA: Diagnosis not present

## 2019-06-17 DIAGNOSIS — H43813 Vitreous degeneration, bilateral: Secondary | ICD-10-CM | POA: Diagnosis not present

## 2019-06-17 DIAGNOSIS — H40033 Anatomical narrow angle, bilateral: Secondary | ICD-10-CM | POA: Diagnosis not present

## 2019-06-17 DIAGNOSIS — H2513 Age-related nuclear cataract, bilateral: Secondary | ICD-10-CM | POA: Diagnosis not present

## 2019-06-20 DIAGNOSIS — G894 Chronic pain syndrome: Secondary | ICD-10-CM | POA: Diagnosis not present

## 2019-06-20 DIAGNOSIS — J449 Chronic obstructive pulmonary disease, unspecified: Secondary | ICD-10-CM | POA: Diagnosis not present

## 2019-06-20 DIAGNOSIS — M779 Enthesopathy, unspecified: Secondary | ICD-10-CM | POA: Diagnosis not present

## 2019-06-20 DIAGNOSIS — M1991 Primary osteoarthritis, unspecified site: Secondary | ICD-10-CM | POA: Diagnosis not present

## 2019-06-24 DIAGNOSIS — H524 Presbyopia: Secondary | ICD-10-CM | POA: Diagnosis not present

## 2019-06-24 DIAGNOSIS — H527 Unspecified disorder of refraction: Secondary | ICD-10-CM | POA: Diagnosis not present

## 2019-06-24 DIAGNOSIS — H52203 Unspecified astigmatism, bilateral: Secondary | ICD-10-CM | POA: Diagnosis not present

## 2019-06-24 DIAGNOSIS — H5203 Hypermetropia, bilateral: Secondary | ICD-10-CM | POA: Diagnosis not present

## 2019-07-08 ENCOUNTER — Ambulatory Visit (HOSPITAL_COMMUNITY)
Admission: RE | Admit: 2019-07-08 | Discharge: 2019-07-08 | Disposition: A | Discharge status: Home / Self Care | Payer: Medicare HMO | Source: Ambulatory Visit | Attending: Physician Assistant | Admitting: Physician Assistant

## 2019-07-08 ENCOUNTER — Other Ambulatory Visit: Payer: Self-pay

## 2019-07-08 DIAGNOSIS — J329 Chronic sinusitis, unspecified: Secondary | ICD-10-CM | POA: Diagnosis not present

## 2019-07-08 DIAGNOSIS — G894 Chronic pain syndrome: Secondary | ICD-10-CM | POA: Diagnosis not present

## 2019-07-08 DIAGNOSIS — M1991 Primary osteoarthritis, unspecified site: Secondary | ICD-10-CM | POA: Diagnosis not present

## 2019-08-04 DIAGNOSIS — H40033 Anatomical narrow angle, bilateral: Secondary | ICD-10-CM | POA: Diagnosis not present

## 2019-08-08 DIAGNOSIS — G894 Chronic pain syndrome: Secondary | ICD-10-CM | POA: Diagnosis not present

## 2019-08-20 DIAGNOSIS — F329 Major depressive disorder, single episode, unspecified: Secondary | ICD-10-CM | POA: Diagnosis not present

## 2019-08-20 DIAGNOSIS — E7849 Other hyperlipidemia: Secondary | ICD-10-CM | POA: Diagnosis not present

## 2019-08-20 DIAGNOSIS — J449 Chronic obstructive pulmonary disease, unspecified: Secondary | ICD-10-CM | POA: Diagnosis not present

## 2019-08-20 DIAGNOSIS — M1991 Primary osteoarthritis, unspecified site: Secondary | ICD-10-CM | POA: Diagnosis not present

## 2019-09-07 DIAGNOSIS — K219 Gastro-esophageal reflux disease without esophagitis: Secondary | ICD-10-CM | POA: Diagnosis not present

## 2019-09-07 DIAGNOSIS — G2581 Restless legs syndrome: Secondary | ICD-10-CM | POA: Diagnosis not present

## 2019-09-07 DIAGNOSIS — G894 Chronic pain syndrome: Secondary | ICD-10-CM | POA: Diagnosis not present

## 2019-09-07 DIAGNOSIS — R609 Edema, unspecified: Secondary | ICD-10-CM | POA: Diagnosis not present

## 2019-09-19 DIAGNOSIS — M1991 Primary osteoarthritis, unspecified site: Secondary | ICD-10-CM | POA: Diagnosis not present

## 2019-09-19 DIAGNOSIS — J449 Chronic obstructive pulmonary disease, unspecified: Secondary | ICD-10-CM | POA: Diagnosis not present

## 2019-09-19 DIAGNOSIS — K5903 Drug induced constipation: Secondary | ICD-10-CM | POA: Diagnosis not present

## 2019-09-19 DIAGNOSIS — F1120 Opioid dependence, uncomplicated: Secondary | ICD-10-CM | POA: Diagnosis not present

## 2019-10-06 DIAGNOSIS — G894 Chronic pain syndrome: Secondary | ICD-10-CM | POA: Diagnosis not present

## 2019-10-06 DIAGNOSIS — M1991 Primary osteoarthritis, unspecified site: Secondary | ICD-10-CM | POA: Diagnosis not present

## 2019-10-20 DIAGNOSIS — G894 Chronic pain syndrome: Secondary | ICD-10-CM | POA: Diagnosis not present

## 2019-10-20 DIAGNOSIS — M1991 Primary osteoarthritis, unspecified site: Secondary | ICD-10-CM | POA: Diagnosis not present

## 2019-10-20 DIAGNOSIS — F1120 Opioid dependence, uncomplicated: Secondary | ICD-10-CM | POA: Diagnosis not present

## 2019-10-20 DIAGNOSIS — F4542 Pain disorder with related psychological factors: Secondary | ICD-10-CM | POA: Diagnosis not present

## 2019-10-28 IMAGING — CT CT MAXILLOFACIAL W/O CM
3 series · 16 of 47 positions shown, 19 images · non-contrast
Comparison: None.

CLINICAL DATA: Chronic sinusitis.

EXAM:
CT MAXILLOFACIAL WITHOUT CONTRAST
TECHNIQUE: Multidetector CT imaging of the maxillofacial structures was
performed. Multiplanar CT image reconstructions were also generated.

[Series 2: max soft · axial · 0.32mm/px · z∈[+17,+151]mm · 10 of 79 slices shown, 13 images]
[im 6/79  brain]
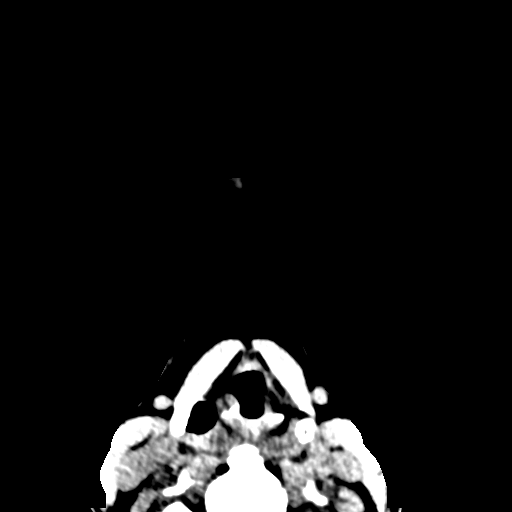
[im 6/79  bone]
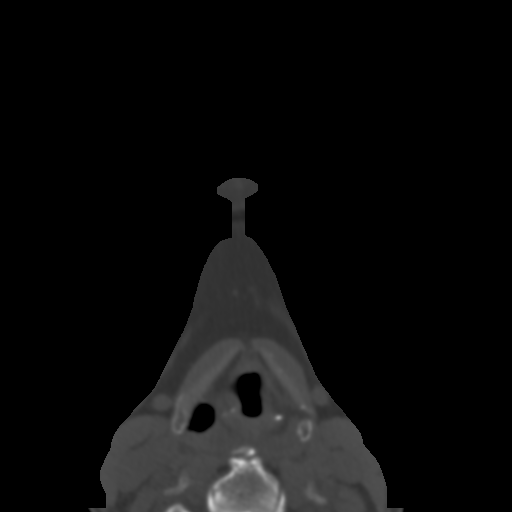
[im 14/79  bone]
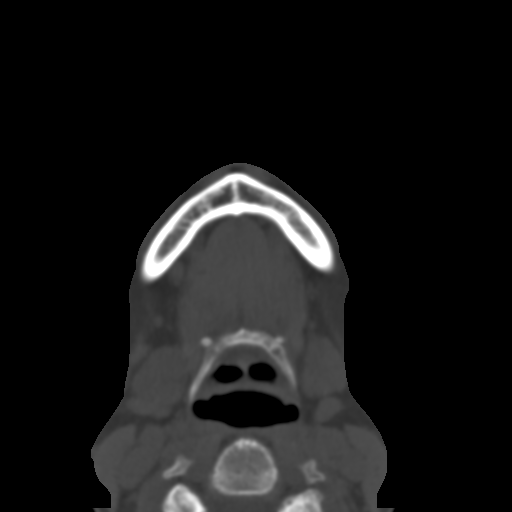
[im 22/79  bone]
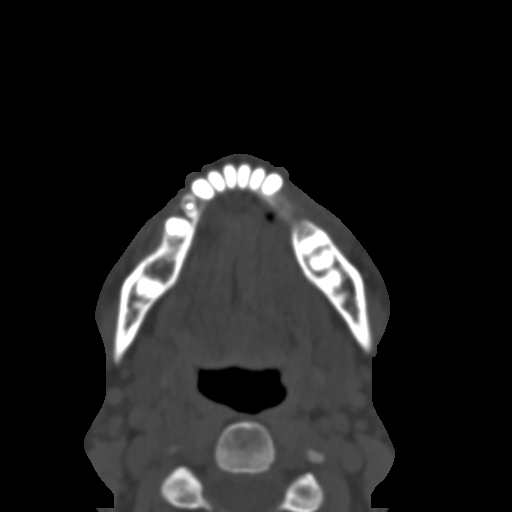
[im 27/79  bone]
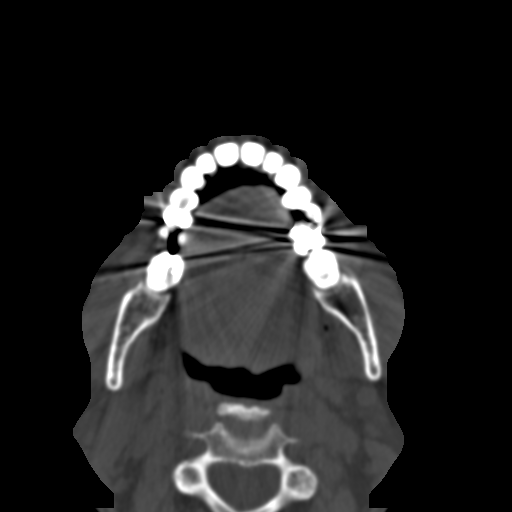
[im 35/79  brain]
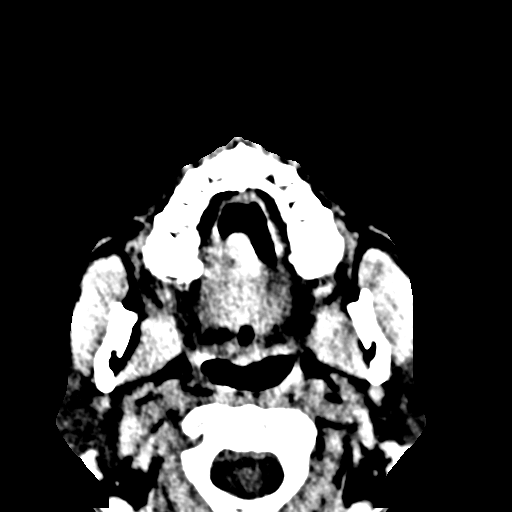
[im 35/79  bone]
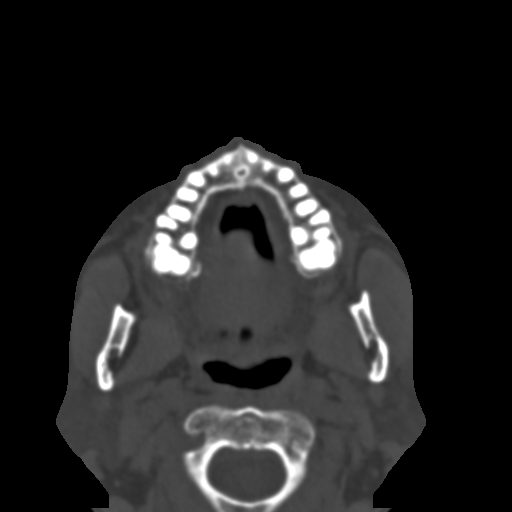
[im 44/79  bone]
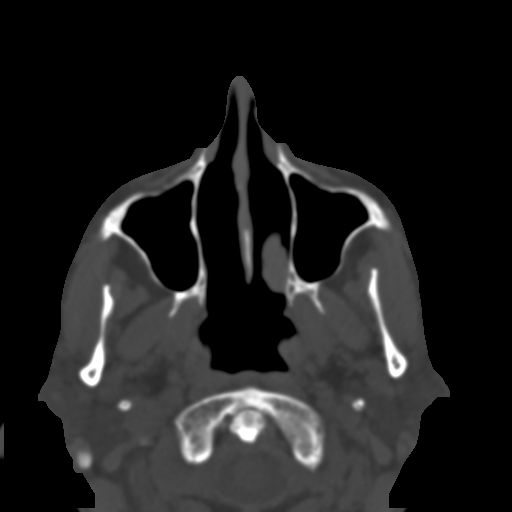
[im 52/79  bone]
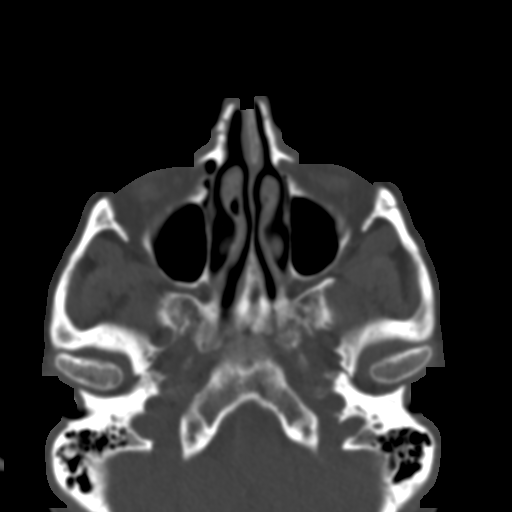
[im 60/79  bone]
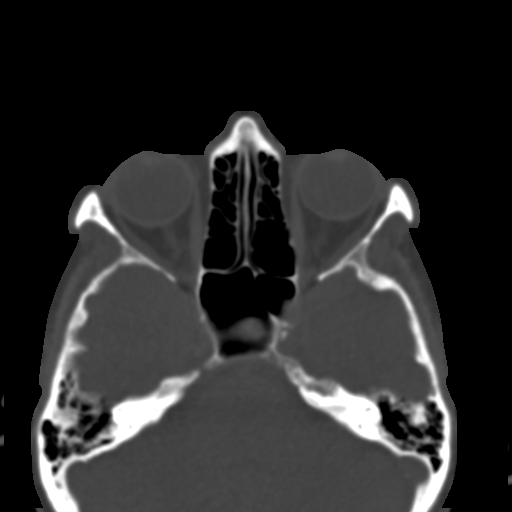
[im 65/79  brain]
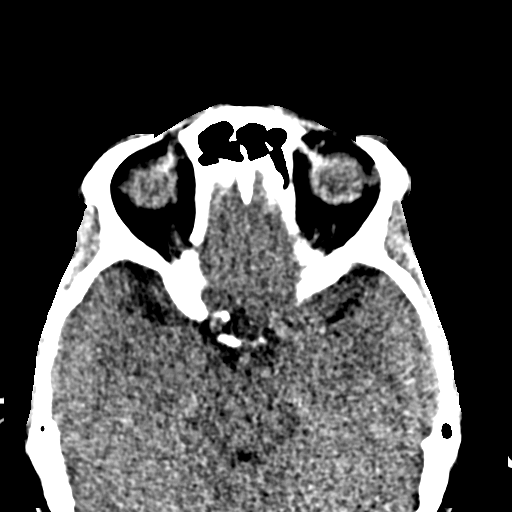
[im 65/79  bone]
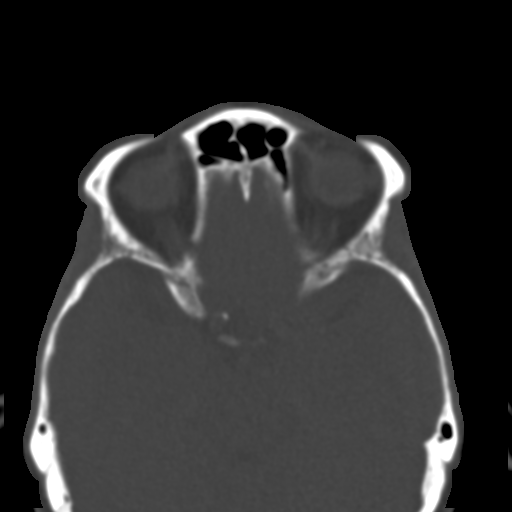
[im 73/79  bone]
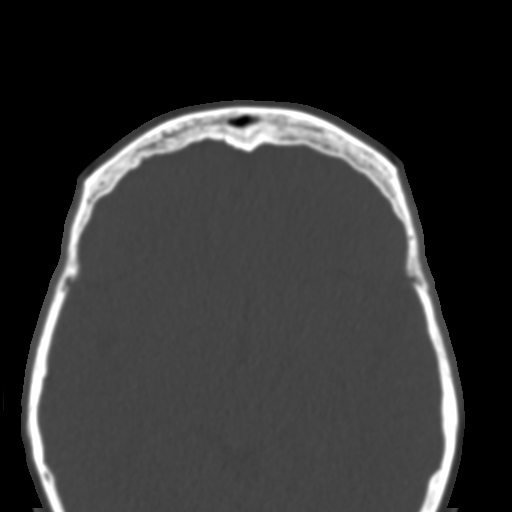

[Series 6: coronal soft · coronal · 0.31mm/px · 3 of 70 slices shown]
[im 24/70  bone]
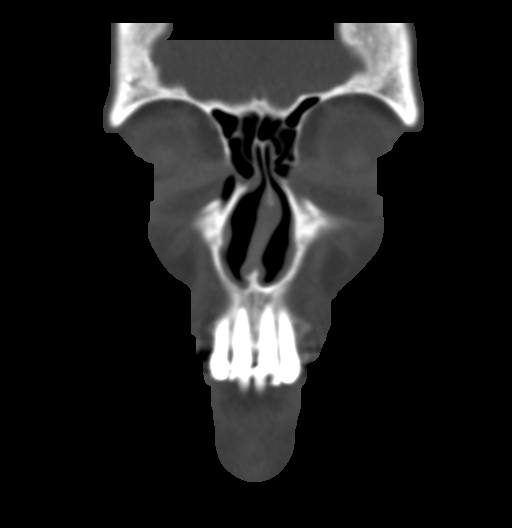
[im 31/70  bone]
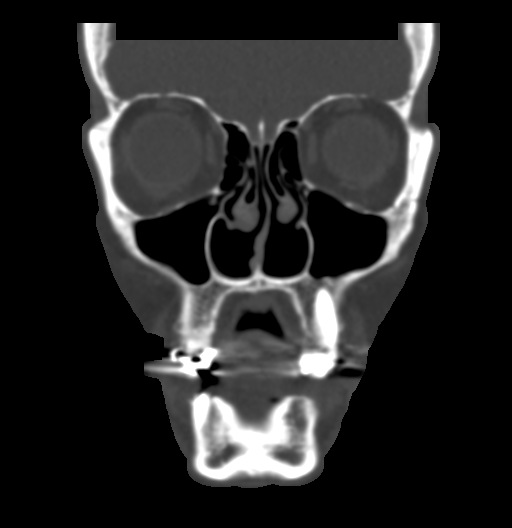
[im 39/70  bone]
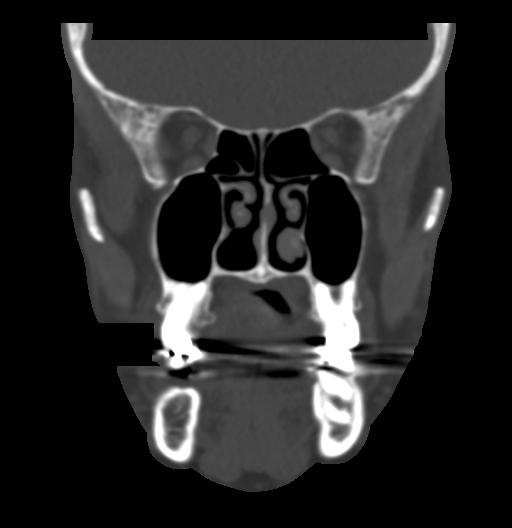

[Series 7: sagittal soft · sagittal · 0.31mm/px · 3 of 73 slices shown]
[im 25/73  bone]
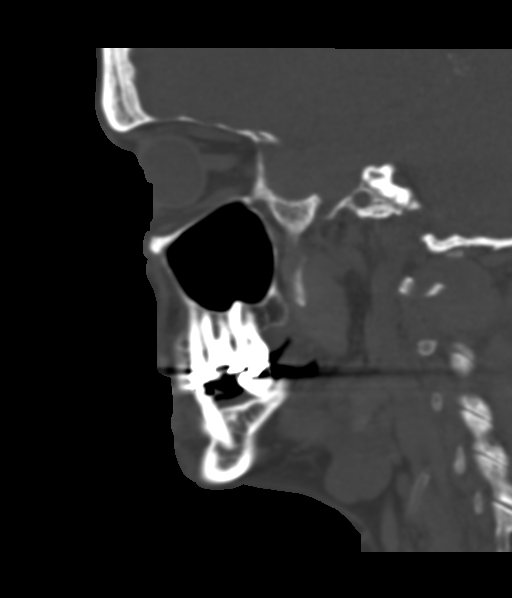
[im 37/73  bone]
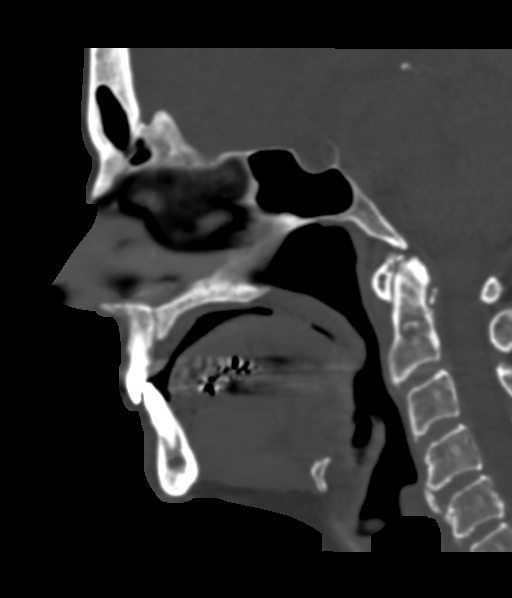
[im 49/73  bone]
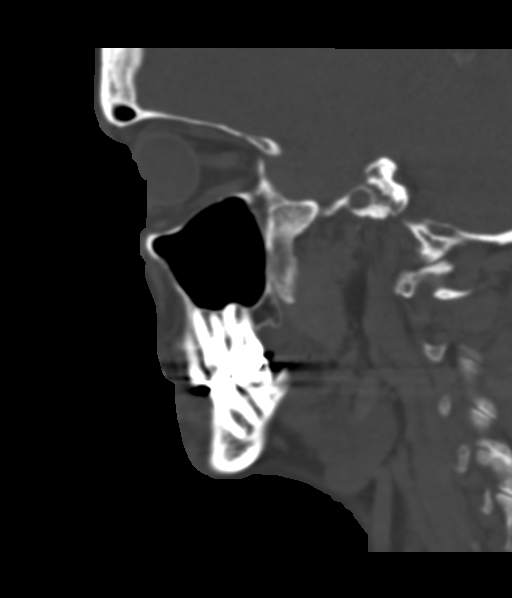

[16 of 47 positions shown; findings below may reference images not displayed]

FINDINGS: Paranasal sinuses are clear. Bilateral ostiomeatal units are patent.
Osseous structures about the paranasal sinuses are intact and normal
in mineralization. Soft tissues about the facial bones are
unremarkable. Periorbital and retro-orbital soft tissues are
unremarkable. Mastoid air cells are clear.
IMPRESSION: Clear paranasal sinuses.

## 2019-11-07 DIAGNOSIS — R609 Edema, unspecified: Secondary | ICD-10-CM | POA: Diagnosis not present

## 2019-11-07 DIAGNOSIS — G4709 Other insomnia: Secondary | ICD-10-CM | POA: Diagnosis not present

## 2019-11-07 DIAGNOSIS — G894 Chronic pain syndrome: Secondary | ICD-10-CM | POA: Diagnosis not present

## 2019-11-07 DIAGNOSIS — M1991 Primary osteoarthritis, unspecified site: Secondary | ICD-10-CM | POA: Diagnosis not present

## 2019-11-20 DIAGNOSIS — M1991 Primary osteoarthritis, unspecified site: Secondary | ICD-10-CM | POA: Diagnosis not present

## 2019-11-20 DIAGNOSIS — F1120 Opioid dependence, uncomplicated: Secondary | ICD-10-CM | POA: Diagnosis not present

## 2019-11-20 DIAGNOSIS — G894 Chronic pain syndrome: Secondary | ICD-10-CM | POA: Diagnosis not present

## 2019-11-20 DIAGNOSIS — F4542 Pain disorder with related psychological factors: Secondary | ICD-10-CM | POA: Diagnosis not present

## 2019-12-06 DIAGNOSIS — H40003 Preglaucoma, unspecified, bilateral: Secondary | ICD-10-CM | POA: Diagnosis not present

## 2019-12-06 DIAGNOSIS — H02831 Dermatochalasis of right upper eyelid: Secondary | ICD-10-CM | POA: Diagnosis not present

## 2019-12-06 DIAGNOSIS — H2513 Age-related nuclear cataract, bilateral: Secondary | ICD-10-CM | POA: Diagnosis not present

## 2019-12-06 DIAGNOSIS — H02834 Dermatochalasis of left upper eyelid: Secondary | ICD-10-CM | POA: Diagnosis not present

## 2019-12-07 DIAGNOSIS — G894 Chronic pain syndrome: Secondary | ICD-10-CM | POA: Diagnosis not present

## 2019-12-18 DIAGNOSIS — F1120 Opioid dependence, uncomplicated: Secondary | ICD-10-CM | POA: Diagnosis not present

## 2019-12-18 DIAGNOSIS — F4542 Pain disorder with related psychological factors: Secondary | ICD-10-CM | POA: Diagnosis not present

## 2019-12-18 DIAGNOSIS — G894 Chronic pain syndrome: Secondary | ICD-10-CM | POA: Diagnosis not present

## 2019-12-18 DIAGNOSIS — M1991 Primary osteoarthritis, unspecified site: Secondary | ICD-10-CM | POA: Diagnosis not present

## 2020-01-04 DIAGNOSIS — M1991 Primary osteoarthritis, unspecified site: Secondary | ICD-10-CM | POA: Diagnosis not present

## 2020-01-04 DIAGNOSIS — G894 Chronic pain syndrome: Secondary | ICD-10-CM | POA: Diagnosis not present

## 2020-01-18 DIAGNOSIS — G894 Chronic pain syndrome: Secondary | ICD-10-CM | POA: Diagnosis not present

## 2020-01-18 DIAGNOSIS — M1991 Primary osteoarthritis, unspecified site: Secondary | ICD-10-CM | POA: Diagnosis not present

## 2020-01-18 DIAGNOSIS — F4542 Pain disorder with related psychological factors: Secondary | ICD-10-CM | POA: Diagnosis not present

## 2020-01-18 DIAGNOSIS — F1120 Opioid dependence, uncomplicated: Secondary | ICD-10-CM | POA: Diagnosis not present

## 2020-02-07 DIAGNOSIS — Z1389 Encounter for screening for other disorder: Secondary | ICD-10-CM | POA: Diagnosis not present

## 2020-02-07 DIAGNOSIS — Z6823 Body mass index (BMI) 23.0-23.9, adult: Secondary | ICD-10-CM | POA: Diagnosis not present

## 2020-02-07 DIAGNOSIS — G894 Chronic pain syndrome: Secondary | ICD-10-CM | POA: Diagnosis not present

## 2020-02-07 DIAGNOSIS — E782 Mixed hyperlipidemia: Secondary | ICD-10-CM | POA: Diagnosis not present

## 2020-02-07 DIAGNOSIS — Z0000 Encounter for general adult medical examination without abnormal findings: Secondary | ICD-10-CM | POA: Diagnosis not present

## 2020-02-07 DIAGNOSIS — M1991 Primary osteoarthritis, unspecified site: Secondary | ICD-10-CM | POA: Diagnosis not present

## 2020-02-07 DIAGNOSIS — F329 Major depressive disorder, single episode, unspecified: Secondary | ICD-10-CM | POA: Diagnosis not present

## 2020-02-07 DIAGNOSIS — Z0001 Encounter for general adult medical examination with abnormal findings: Secondary | ICD-10-CM | POA: Diagnosis not present

## 2020-02-17 DIAGNOSIS — F4542 Pain disorder with related psychological factors: Secondary | ICD-10-CM | POA: Diagnosis not present

## 2020-02-17 DIAGNOSIS — M1991 Primary osteoarthritis, unspecified site: Secondary | ICD-10-CM | POA: Diagnosis not present

## 2020-02-17 DIAGNOSIS — F1120 Opioid dependence, uncomplicated: Secondary | ICD-10-CM | POA: Diagnosis not present

## 2020-02-17 DIAGNOSIS — G894 Chronic pain syndrome: Secondary | ICD-10-CM | POA: Diagnosis not present

## 2020-02-24 DIAGNOSIS — H57813 Brow ptosis, bilateral: Secondary | ICD-10-CM | POA: Diagnosis not present

## 2020-02-24 DIAGNOSIS — H02834 Dermatochalasis of left upper eyelid: Secondary | ICD-10-CM | POA: Diagnosis not present

## 2020-02-24 DIAGNOSIS — H02831 Dermatochalasis of right upper eyelid: Secondary | ICD-10-CM | POA: Diagnosis not present

## 2020-02-24 DIAGNOSIS — J449 Chronic obstructive pulmonary disease, unspecified: Secondary | ICD-10-CM | POA: Diagnosis not present

## 2020-03-06 DIAGNOSIS — M1991 Primary osteoarthritis, unspecified site: Secondary | ICD-10-CM | POA: Diagnosis not present

## 2020-03-06 DIAGNOSIS — G894 Chronic pain syndrome: Secondary | ICD-10-CM | POA: Diagnosis not present

## 2020-03-19 DIAGNOSIS — F4542 Pain disorder with related psychological factors: Secondary | ICD-10-CM | POA: Diagnosis not present

## 2020-03-19 DIAGNOSIS — M1991 Primary osteoarthritis, unspecified site: Secondary | ICD-10-CM | POA: Diagnosis not present

## 2020-03-19 DIAGNOSIS — F1120 Opioid dependence, uncomplicated: Secondary | ICD-10-CM | POA: Diagnosis not present

## 2020-03-19 DIAGNOSIS — G894 Chronic pain syndrome: Secondary | ICD-10-CM | POA: Diagnosis not present

## 2020-04-05 DIAGNOSIS — G4709 Other insomnia: Secondary | ICD-10-CM | POA: Diagnosis not present

## 2020-04-05 DIAGNOSIS — R609 Edema, unspecified: Secondary | ICD-10-CM | POA: Diagnosis not present

## 2020-04-05 DIAGNOSIS — G894 Chronic pain syndrome: Secondary | ICD-10-CM | POA: Diagnosis not present

## 2020-04-05 DIAGNOSIS — M1991 Primary osteoarthritis, unspecified site: Secondary | ICD-10-CM | POA: Diagnosis not present

## 2020-04-18 DIAGNOSIS — G894 Chronic pain syndrome: Secondary | ICD-10-CM | POA: Diagnosis not present

## 2020-04-18 DIAGNOSIS — F1120 Opioid dependence, uncomplicated: Secondary | ICD-10-CM | POA: Diagnosis not present

## 2020-04-18 DIAGNOSIS — F4542 Pain disorder with related psychological factors: Secondary | ICD-10-CM | POA: Diagnosis not present

## 2020-04-18 DIAGNOSIS — M1991 Primary osteoarthritis, unspecified site: Secondary | ICD-10-CM | POA: Diagnosis not present

## 2020-05-03 DIAGNOSIS — G894 Chronic pain syndrome: Secondary | ICD-10-CM | POA: Diagnosis not present

## 2020-05-03 DIAGNOSIS — L239 Allergic contact dermatitis, unspecified cause: Secondary | ICD-10-CM | POA: Diagnosis not present

## 2020-05-03 DIAGNOSIS — N342 Other urethritis: Secondary | ICD-10-CM | POA: Diagnosis not present

## 2020-05-18 DIAGNOSIS — G894 Chronic pain syndrome: Secondary | ICD-10-CM | POA: Diagnosis not present

## 2020-05-18 DIAGNOSIS — F1120 Opioid dependence, uncomplicated: Secondary | ICD-10-CM | POA: Diagnosis not present

## 2020-05-18 DIAGNOSIS — M1991 Primary osteoarthritis, unspecified site: Secondary | ICD-10-CM | POA: Diagnosis not present

## 2020-05-18 DIAGNOSIS — F4542 Pain disorder with related psychological factors: Secondary | ICD-10-CM | POA: Diagnosis not present

## 2020-06-05 DIAGNOSIS — Z6822 Body mass index (BMI) 22.0-22.9, adult: Secondary | ICD-10-CM | POA: Diagnosis not present

## 2020-06-05 DIAGNOSIS — M1991 Primary osteoarthritis, unspecified site: Secondary | ICD-10-CM | POA: Diagnosis not present

## 2020-06-05 DIAGNOSIS — K219 Gastro-esophageal reflux disease without esophagitis: Secondary | ICD-10-CM | POA: Diagnosis not present

## 2020-06-05 DIAGNOSIS — G894 Chronic pain syndrome: Secondary | ICD-10-CM | POA: Diagnosis not present

## 2020-06-19 DIAGNOSIS — G894 Chronic pain syndrome: Secondary | ICD-10-CM | POA: Diagnosis not present

## 2020-06-19 DIAGNOSIS — F1120 Opioid dependence, uncomplicated: Secondary | ICD-10-CM | POA: Diagnosis not present

## 2020-06-19 DIAGNOSIS — M1991 Primary osteoarthritis, unspecified site: Secondary | ICD-10-CM | POA: Diagnosis not present

## 2020-06-19 DIAGNOSIS — F4542 Pain disorder with related psychological factors: Secondary | ICD-10-CM | POA: Diagnosis not present

## 2020-07-05 DIAGNOSIS — M1991 Primary osteoarthritis, unspecified site: Secondary | ICD-10-CM | POA: Diagnosis not present

## 2020-07-05 DIAGNOSIS — M2550 Pain in unspecified joint: Secondary | ICD-10-CM | POA: Diagnosis not present

## 2020-07-05 DIAGNOSIS — G894 Chronic pain syndrome: Secondary | ICD-10-CM | POA: Diagnosis not present

## 2020-07-17 DIAGNOSIS — R319 Hematuria, unspecified: Secondary | ICD-10-CM | POA: Diagnosis not present

## 2020-07-17 DIAGNOSIS — Z6822 Body mass index (BMI) 22.0-22.9, adult: Secondary | ICD-10-CM | POA: Diagnosis not present

## 2020-07-17 DIAGNOSIS — J3489 Other specified disorders of nose and nasal sinuses: Secondary | ICD-10-CM | POA: Diagnosis not present

## 2020-07-17 DIAGNOSIS — N342 Other urethritis: Secondary | ICD-10-CM | POA: Diagnosis not present

## 2020-07-17 DIAGNOSIS — B9689 Other specified bacterial agents as the cause of diseases classified elsewhere: Secondary | ICD-10-CM | POA: Diagnosis not present

## 2020-07-19 DIAGNOSIS — F1120 Opioid dependence, uncomplicated: Secondary | ICD-10-CM | POA: Diagnosis not present

## 2020-07-19 DIAGNOSIS — G894 Chronic pain syndrome: Secondary | ICD-10-CM | POA: Diagnosis not present

## 2020-07-19 DIAGNOSIS — F4542 Pain disorder with related psychological factors: Secondary | ICD-10-CM | POA: Diagnosis not present

## 2020-07-19 DIAGNOSIS — M1991 Primary osteoarthritis, unspecified site: Secondary | ICD-10-CM | POA: Diagnosis not present

## 2020-08-02 DIAGNOSIS — R609 Edema, unspecified: Secondary | ICD-10-CM | POA: Diagnosis not present

## 2020-08-02 DIAGNOSIS — M1991 Primary osteoarthritis, unspecified site: Secondary | ICD-10-CM | POA: Diagnosis not present

## 2020-08-02 DIAGNOSIS — G894 Chronic pain syndrome: Secondary | ICD-10-CM | POA: Diagnosis not present

## 2020-08-02 DIAGNOSIS — K219 Gastro-esophageal reflux disease without esophagitis: Secondary | ICD-10-CM | POA: Diagnosis not present

## 2020-08-18 DIAGNOSIS — G894 Chronic pain syndrome: Secondary | ICD-10-CM | POA: Diagnosis not present

## 2020-08-18 DIAGNOSIS — M1991 Primary osteoarthritis, unspecified site: Secondary | ICD-10-CM | POA: Diagnosis not present

## 2020-08-18 DIAGNOSIS — F1120 Opioid dependence, uncomplicated: Secondary | ICD-10-CM | POA: Diagnosis not present

## 2020-08-18 DIAGNOSIS — F4542 Pain disorder with related psychological factors: Secondary | ICD-10-CM | POA: Diagnosis not present

## 2020-09-04 DIAGNOSIS — G894 Chronic pain syndrome: Secondary | ICD-10-CM | POA: Diagnosis not present

## 2020-09-04 DIAGNOSIS — Z6823 Body mass index (BMI) 23.0-23.9, adult: Secondary | ICD-10-CM | POA: Diagnosis not present

## 2020-09-18 DIAGNOSIS — M1991 Primary osteoarthritis, unspecified site: Secondary | ICD-10-CM | POA: Diagnosis not present

## 2020-09-18 DIAGNOSIS — G894 Chronic pain syndrome: Secondary | ICD-10-CM | POA: Diagnosis not present

## 2020-09-18 DIAGNOSIS — F1120 Opioid dependence, uncomplicated: Secondary | ICD-10-CM | POA: Diagnosis not present

## 2020-09-18 DIAGNOSIS — F4542 Pain disorder with related psychological factors: Secondary | ICD-10-CM | POA: Diagnosis not present

## 2020-10-03 DIAGNOSIS — G894 Chronic pain syndrome: Secondary | ICD-10-CM | POA: Diagnosis not present

## 2020-10-19 DIAGNOSIS — F4542 Pain disorder with related psychological factors: Secondary | ICD-10-CM | POA: Diagnosis not present

## 2020-10-19 DIAGNOSIS — M1991 Primary osteoarthritis, unspecified site: Secondary | ICD-10-CM | POA: Diagnosis not present

## 2020-10-19 DIAGNOSIS — G894 Chronic pain syndrome: Secondary | ICD-10-CM | POA: Diagnosis not present

## 2020-10-19 DIAGNOSIS — F1120 Opioid dependence, uncomplicated: Secondary | ICD-10-CM | POA: Diagnosis not present

## 2020-11-01 DIAGNOSIS — G894 Chronic pain syndrome: Secondary | ICD-10-CM | POA: Diagnosis not present

## 2020-11-01 DIAGNOSIS — M1991 Primary osteoarthritis, unspecified site: Secondary | ICD-10-CM | POA: Diagnosis not present

## 2020-11-14 DIAGNOSIS — Z6824 Body mass index (BMI) 24.0-24.9, adult: Secondary | ICD-10-CM | POA: Diagnosis not present

## 2020-11-14 DIAGNOSIS — M2570 Osteophyte, unspecified joint: Secondary | ICD-10-CM | POA: Diagnosis not present

## 2020-11-14 DIAGNOSIS — F419 Anxiety disorder, unspecified: Secondary | ICD-10-CM | POA: Diagnosis not present

## 2020-12-04 DIAGNOSIS — Z6823 Body mass index (BMI) 23.0-23.9, adult: Secondary | ICD-10-CM | POA: Diagnosis not present

## 2020-12-04 DIAGNOSIS — Z1389 Encounter for screening for other disorder: Secondary | ICD-10-CM | POA: Diagnosis not present

## 2020-12-04 DIAGNOSIS — M1991 Primary osteoarthritis, unspecified site: Secondary | ICD-10-CM | POA: Diagnosis not present

## 2020-12-04 DIAGNOSIS — G894 Chronic pain syndrome: Secondary | ICD-10-CM | POA: Diagnosis not present

## 2020-12-04 DIAGNOSIS — F329 Major depressive disorder, single episode, unspecified: Secondary | ICD-10-CM | POA: Diagnosis not present

## 2020-12-04 DIAGNOSIS — E7849 Other hyperlipidemia: Secondary | ICD-10-CM | POA: Diagnosis not present

## 2020-12-04 DIAGNOSIS — E782 Mixed hyperlipidemia: Secondary | ICD-10-CM | POA: Diagnosis not present

## 2020-12-04 DIAGNOSIS — F1120 Opioid dependence, uncomplicated: Secondary | ICD-10-CM | POA: Diagnosis not present

## 2020-12-04 DIAGNOSIS — M7061 Trochanteric bursitis, right hip: Secondary | ICD-10-CM | POA: Diagnosis not present

## 2020-12-04 DIAGNOSIS — Z0000 Encounter for general adult medical examination without abnormal findings: Secondary | ICD-10-CM | POA: Diagnosis not present

## 2020-12-17 DIAGNOSIS — M1991 Primary osteoarthritis, unspecified site: Secondary | ICD-10-CM | POA: Diagnosis not present

## 2020-12-17 DIAGNOSIS — E7849 Other hyperlipidemia: Secondary | ICD-10-CM | POA: Diagnosis not present

## 2021-01-01 DIAGNOSIS — G894 Chronic pain syndrome: Secondary | ICD-10-CM | POA: Diagnosis not present

## 2021-01-16 DIAGNOSIS — G894 Chronic pain syndrome: Secondary | ICD-10-CM | POA: Diagnosis not present

## 2021-01-16 DIAGNOSIS — E7849 Other hyperlipidemia: Secondary | ICD-10-CM | POA: Diagnosis not present

## 2021-01-16 DIAGNOSIS — M1991 Primary osteoarthritis, unspecified site: Secondary | ICD-10-CM | POA: Diagnosis not present

## 2021-01-16 DIAGNOSIS — F1120 Opioid dependence, uncomplicated: Secondary | ICD-10-CM | POA: Diagnosis not present

## 2021-01-31 DIAGNOSIS — G894 Chronic pain syndrome: Secondary | ICD-10-CM | POA: Diagnosis not present

## 2021-01-31 DIAGNOSIS — G4709 Other insomnia: Secondary | ICD-10-CM | POA: Diagnosis not present

## 2021-01-31 DIAGNOSIS — M1991 Primary osteoarthritis, unspecified site: Secondary | ICD-10-CM | POA: Diagnosis not present

## 2021-02-16 DIAGNOSIS — M1991 Primary osteoarthritis, unspecified site: Secondary | ICD-10-CM | POA: Diagnosis not present

## 2021-02-16 DIAGNOSIS — E7849 Other hyperlipidemia: Secondary | ICD-10-CM | POA: Diagnosis not present

## 2021-02-28 DIAGNOSIS — G894 Chronic pain syndrome: Secondary | ICD-10-CM | POA: Diagnosis not present

## 2021-02-28 DIAGNOSIS — Z6822 Body mass index (BMI) 22.0-22.9, adult: Secondary | ICD-10-CM | POA: Diagnosis not present

## 2021-02-28 DIAGNOSIS — Z1331 Encounter for screening for depression: Secondary | ICD-10-CM | POA: Diagnosis not present

## 2021-02-28 DIAGNOSIS — M1991 Primary osteoarthritis, unspecified site: Secondary | ICD-10-CM | POA: Diagnosis not present

## 2021-03-19 DIAGNOSIS — E7849 Other hyperlipidemia: Secondary | ICD-10-CM | POA: Diagnosis not present

## 2021-03-19 DIAGNOSIS — M1991 Primary osteoarthritis, unspecified site: Secondary | ICD-10-CM | POA: Diagnosis not present

## 2021-03-28 DIAGNOSIS — G894 Chronic pain syndrome: Secondary | ICD-10-CM | POA: Diagnosis not present

## 2021-03-28 DIAGNOSIS — M1991 Primary osteoarthritis, unspecified site: Secondary | ICD-10-CM | POA: Diagnosis not present

## 2021-04-16 ENCOUNTER — Other Ambulatory Visit: Payer: Self-pay | Admitting: Internal Medicine

## 2021-04-16 DIAGNOSIS — Z139 Encounter for screening, unspecified: Secondary | ICD-10-CM

## 2021-04-18 DIAGNOSIS — E782 Mixed hyperlipidemia: Secondary | ICD-10-CM | POA: Diagnosis not present

## 2021-04-18 DIAGNOSIS — M1991 Primary osteoarthritis, unspecified site: Secondary | ICD-10-CM | POA: Diagnosis not present

## 2021-04-29 DIAGNOSIS — G894 Chronic pain syndrome: Secondary | ICD-10-CM | POA: Diagnosis not present

## 2021-04-29 DIAGNOSIS — M1991 Primary osteoarthritis, unspecified site: Secondary | ICD-10-CM | POA: Diagnosis not present

## 2021-04-30 ENCOUNTER — Other Ambulatory Visit: Payer: Self-pay

## 2021-04-30 ENCOUNTER — Ambulatory Visit
Admission: RE | Admit: 2021-04-30 | Discharge: 2021-04-30 | Disposition: A | Discharge status: Home / Self Care | Payer: Medicare HMO | Source: Ambulatory Visit | Attending: Internal Medicine | Admitting: Internal Medicine

## 2021-04-30 DIAGNOSIS — Z1231 Encounter for screening mammogram for malignant neoplasm of breast: Secondary | ICD-10-CM | POA: Diagnosis not present

## 2021-04-30 DIAGNOSIS — Z139 Encounter for screening, unspecified: Secondary | ICD-10-CM

## 2021-05-30 DIAGNOSIS — G894 Chronic pain syndrome: Secondary | ICD-10-CM | POA: Diagnosis not present

## 2021-05-30 DIAGNOSIS — Z681 Body mass index (BMI) 19 or less, adult: Secondary | ICD-10-CM | POA: Diagnosis not present

## 2021-05-30 DIAGNOSIS — I9589 Other hypotension: Secondary | ICD-10-CM | POA: Diagnosis not present

## 2021-07-01 DIAGNOSIS — G894 Chronic pain syndrome: Secondary | ICD-10-CM | POA: Diagnosis not present

## 2021-07-01 DIAGNOSIS — I959 Hypotension, unspecified: Secondary | ICD-10-CM | POA: Diagnosis not present

## 2021-07-01 DIAGNOSIS — Z6822 Body mass index (BMI) 22.0-22.9, adult: Secondary | ICD-10-CM | POA: Diagnosis not present

## 2021-07-23 ENCOUNTER — Ambulatory Visit: Payer: Medicare HMO | Admitting: Cardiovascular Disease

## 2021-07-25 DIAGNOSIS — M1991 Primary osteoarthritis, unspecified site: Secondary | ICD-10-CM | POA: Diagnosis not present

## 2021-07-25 DIAGNOSIS — K219 Gastro-esophageal reflux disease without esophagitis: Secondary | ICD-10-CM | POA: Diagnosis not present

## 2021-07-25 DIAGNOSIS — J22 Unspecified acute lower respiratory infection: Secondary | ICD-10-CM | POA: Diagnosis not present

## 2021-07-25 DIAGNOSIS — G894 Chronic pain syndrome: Secondary | ICD-10-CM | POA: Diagnosis not present

## 2021-08-15 DIAGNOSIS — J22 Unspecified acute lower respiratory infection: Secondary | ICD-10-CM | POA: Diagnosis not present

## 2021-08-20 IMAGING — MG MM DIGITAL SCREENING BILAT W/ TOMO AND CAD
8 series · 9 of 24 positions shown · non-contrast
Comparison: Previous exam(s).

CLINICAL DATA: Screening.

EXAM:
DIGITAL SCREENING BILATERAL MAMMOGRAM WITH TOMOSYNTHESIS AND CAD
TECHNIQUE: Bilateral screening digital craniocaudal and mediolateral oblique
mammograms were obtained. Bilateral screening digital breast
tomosynthesis was performed. The images were evaluated with
computer-aided detection.

[R CC synth-2D]
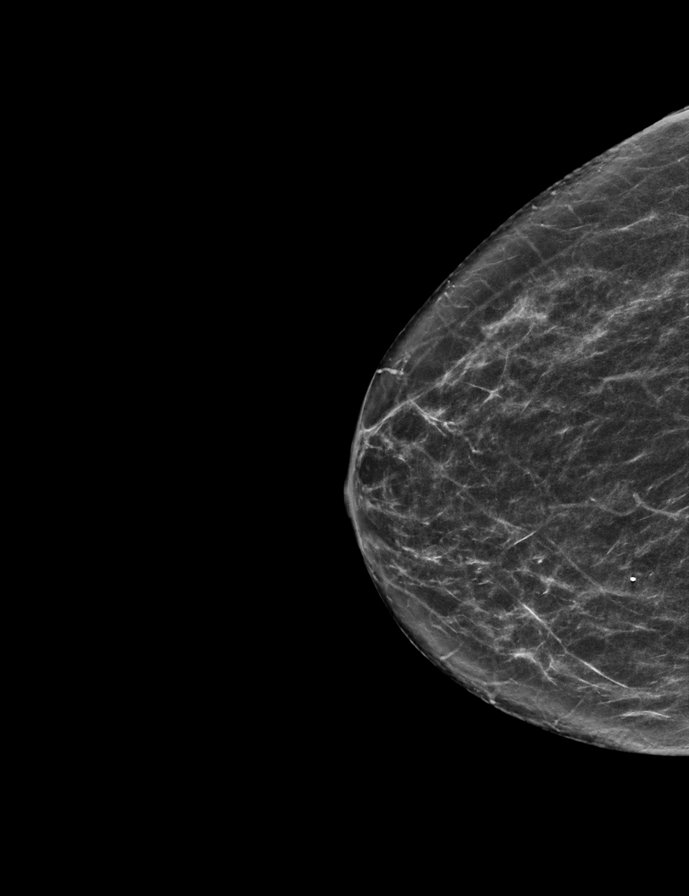

[L MLO synth-2D]
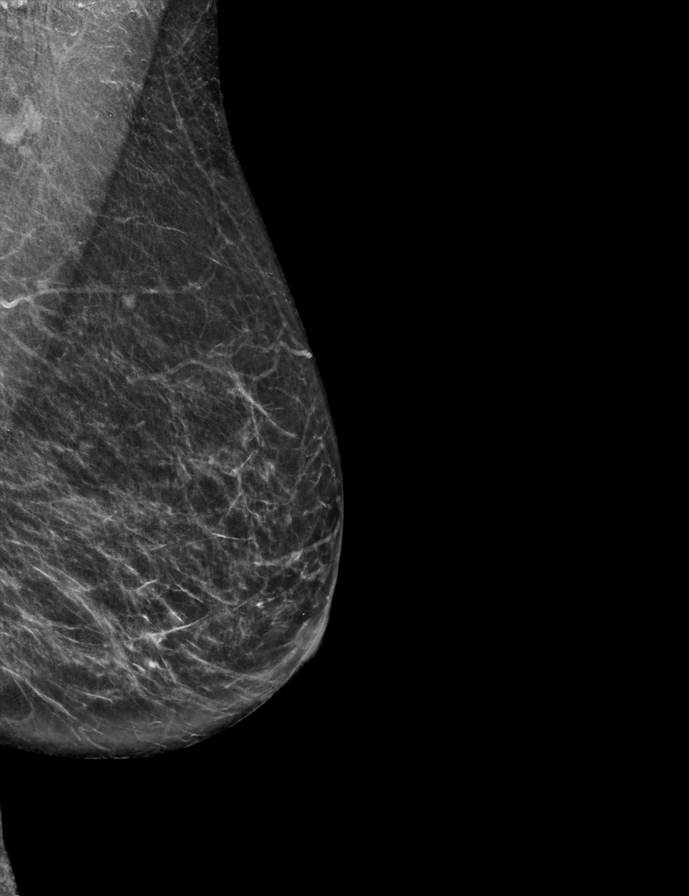

[L CC synth-2D]
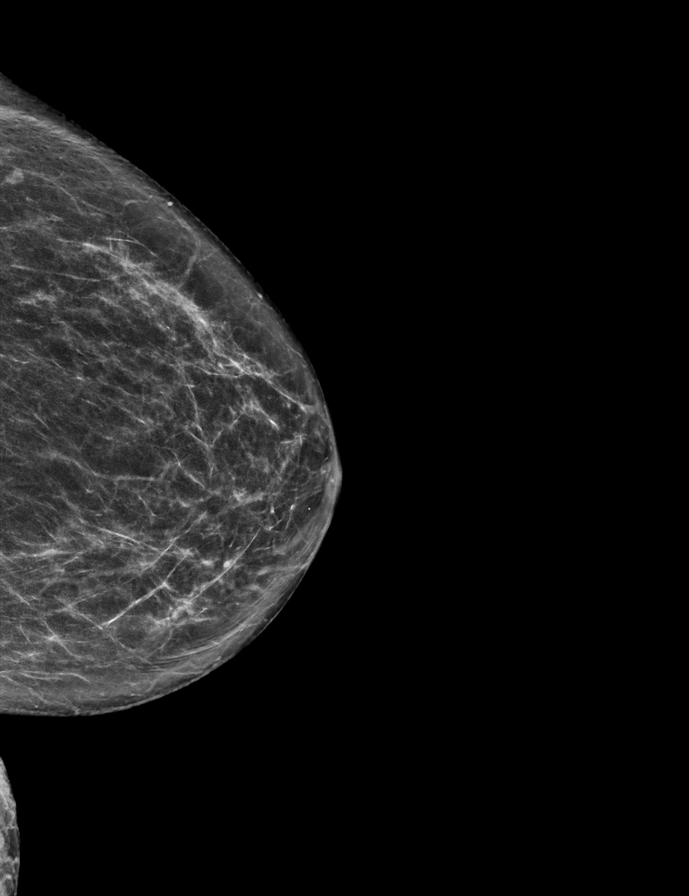

[R MLO synth-2D]
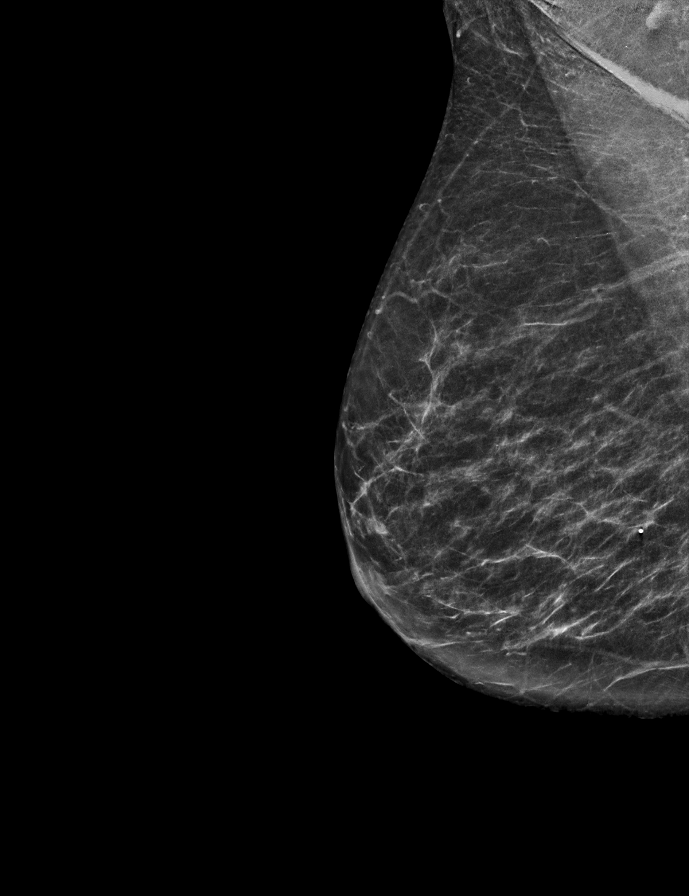

[R MLO tomo · 2 of 59 frames shown]
[frame 20/59]
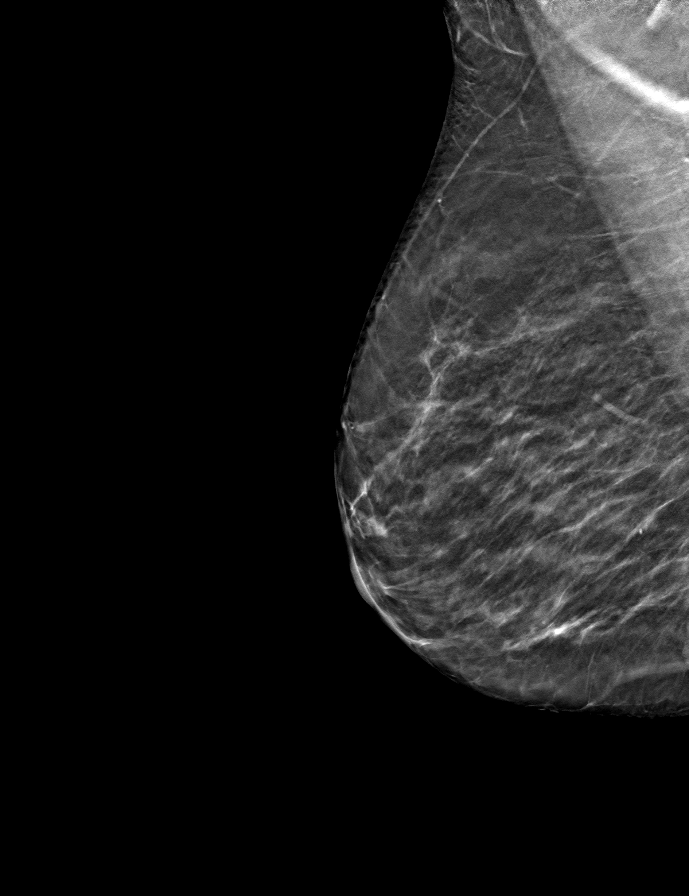
[frame 30/59]
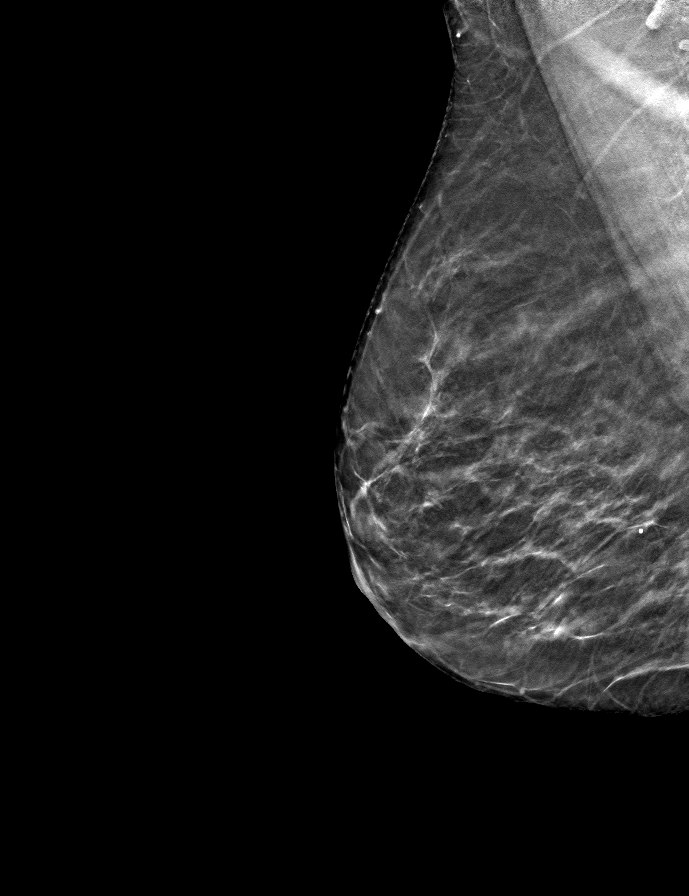

[L MLO tomo · tomo slice 32/63.0]
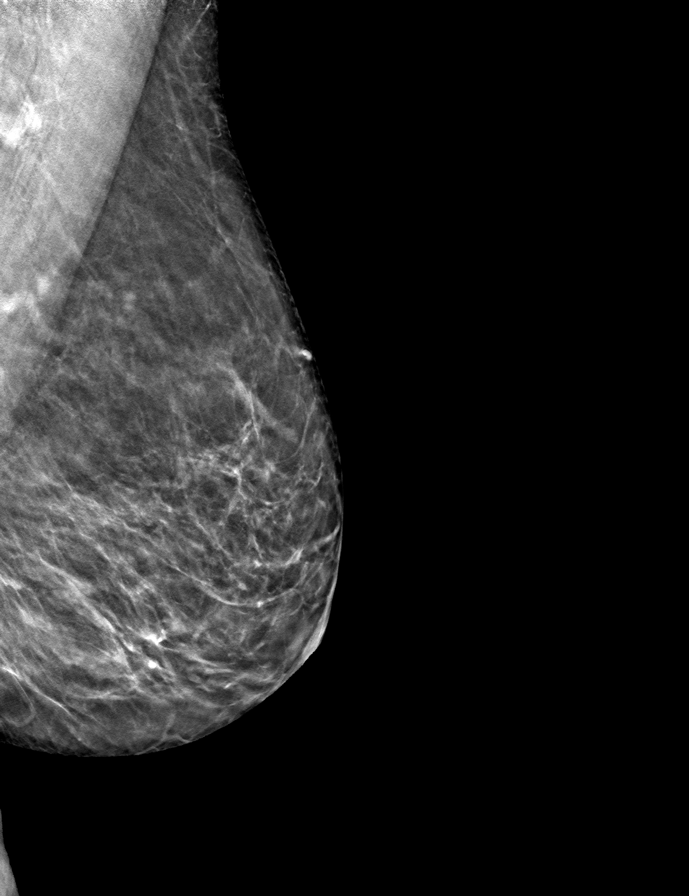

[R CC tomo · tomo slice 31/60.0]
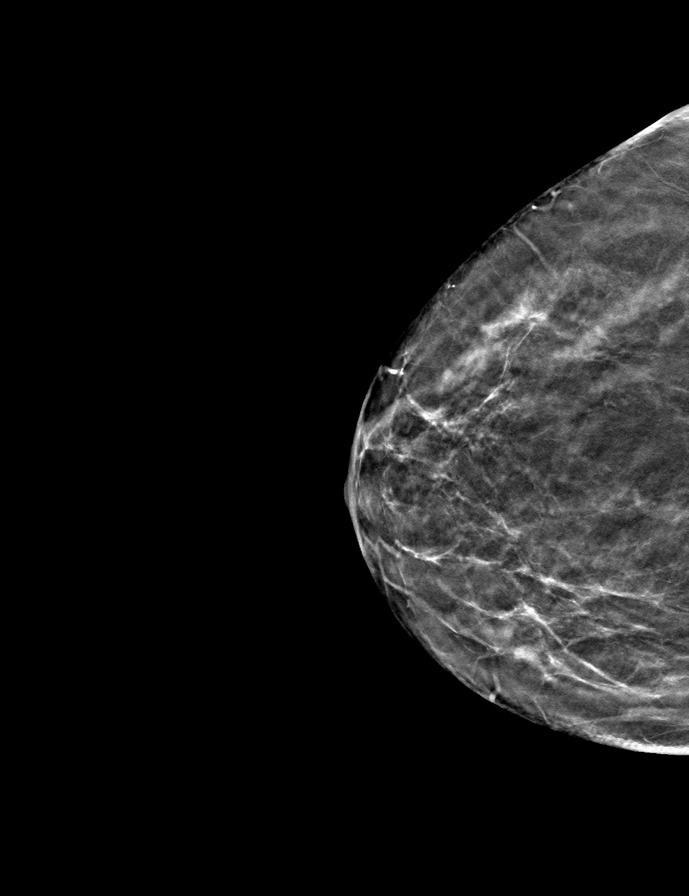

[L CC tomo · tomo slice 31/61.0]
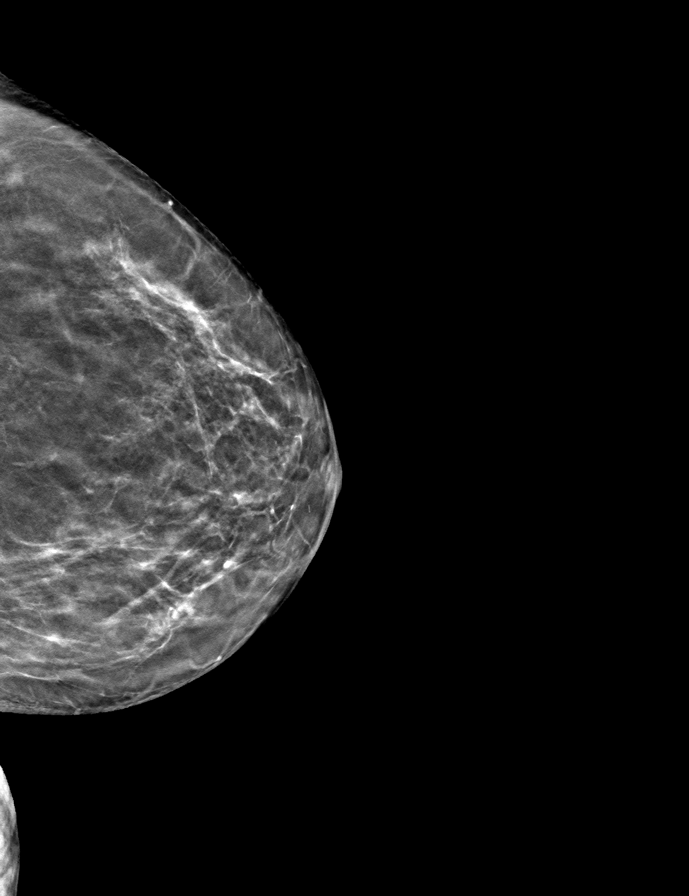

[9 of 24 positions shown; findings below may reference images not displayed]

ACR Breast Density Category b: There are scattered areas of
fibroglandular density.
FINDINGS: There are no findings suspicious for malignancy.
IMPRESSION: No mammographic evidence of malignancy. A result letter of this
screening mammogram will be mailed directly to the patient.

RECOMMENDATION:
Screening mammogram in one year. (Code:51-O-LD2)

BI-RADS CATEGORY  1: Negative.

## 2021-08-28 ENCOUNTER — Ambulatory Visit: Payer: Medicare HMO | Admitting: Cardiology

## 2021-08-29 DIAGNOSIS — G894 Chronic pain syndrome: Secondary | ICD-10-CM | POA: Diagnosis not present

## 2021-08-29 DIAGNOSIS — M1991 Primary osteoarthritis, unspecified site: Secondary | ICD-10-CM | POA: Diagnosis not present

## 2021-09-20 ENCOUNTER — Ambulatory Visit: Payer: Medicare HMO | Admitting: Cardiovascular Disease

## 2021-09-26 DIAGNOSIS — G4709 Other insomnia: Secondary | ICD-10-CM | POA: Diagnosis not present

## 2021-09-26 DIAGNOSIS — Z6822 Body mass index (BMI) 22.0-22.9, adult: Secondary | ICD-10-CM | POA: Diagnosis not present

## 2021-09-26 DIAGNOSIS — G894 Chronic pain syndrome: Secondary | ICD-10-CM | POA: Diagnosis not present

## 2021-09-26 DIAGNOSIS — M1991 Primary osteoarthritis, unspecified site: Secondary | ICD-10-CM | POA: Diagnosis not present

## 2021-10-08 DIAGNOSIS — J22 Unspecified acute lower respiratory infection: Secondary | ICD-10-CM | POA: Diagnosis not present

## 2021-10-21 NOTE — Progress Notes (Deleted)
Cardiology Office Note:    Date:  10/21/2021   ID:  Jessica [name], DOB [id number], MRN [id number]  PCP:  Elfredia Nevins, MD  Cardiologist:  None  Electrophysiologist:  None   Referring MD: Shawnie Dapper, PA-C   No chief complaint on file. ***  History of Present Illness:    Jessica [name] is a 71 y.o. female with a hx of COPD, seizures who is referred by Lenise Herald, PA for evaluation of low blood pressure.  Past Medical History:  Diagnosis Date   Back pain    COPD (chronic obstructive pulmonary disease) (HCC)    Ganglion cyst of wrist    Mental disorder    anxiety   Panic attack    Vertigo     Past Surgical History:  Procedure Laterality Date   ABDOMINAL HYSTERECTOMY     partial   CYSTECTOMY     NASAL SINUS SURGERY  1986   TONSILLECTOMY      Current Medications: No outpatient medications have been marked as taking for the 10/25/21 encounter (Appointment) with Little Ishikawa, MD.     Allergies:   Bee venom, Fluconazole, Prednisone, Bupropion, Penicillins, Remeron [mirtazapine], Sulfa antibiotics, and Tetracyclines & related   Social History   Socioeconomic History   Marital status: Single    Spouse name: Not on file   Number of children: Not on file   Years of education: Not on file   Highest education level: Not on file  Occupational History   Not on file  Tobacco Use   Smoking status: Every Day    Packs/day: 1.00    Types: Cigarettes   Smokeless tobacco: Current  Substance and Sexual Activity   Alcohol use: No   Drug use: No   Sexual activity: Not on file  Other Topics Concern   Not on file  Social History Narrative   Not on file   Social Determinants of Health   Financial Resource Strain: Not on file  Food Insecurity: Not on file  Transportation Needs: Not on file  Physical Activity: Not on file  Stress: Not on file  Social Connections: Not on file     Family History: The patient's ***family history is not on  file.  ROS:   Please see the history of present illness.    *** All other systems reviewed and are negative.  EKGs/Labs/Other Studies Reviewed:    The following studies were reviewed today: ***  EKG:  EKG is *** ordered today.  The ekg ordered today demonstrates ***  Recent Labs: No results found for requested labs within last 8760 hours.  Recent Lipid Panel    Component Value Date/Time   CHOL 250 (H) 12/28/2013 0144   TRIG 211 (H) 12/28/2013 0144   HDL 36 (L) 12/28/2013 0144   CHOLHDL 6.9 12/28/2013 0144   VLDL 42 (H) 12/28/2013 0144   LDLCALC 172 (H) 12/28/2013 0144   LDLDIRECT 152.2 01/21/2007 1139    Physical Exam:    VS:  There were no vitals taken for this visit.    Wt Readings from Last 3 Encounters:  09/22/17 113 lb (51.3 kg)  05/11/17 122 lb (55.3 kg)  09/22/16 130 lb 4.8 oz (59.1 kg)     GEN: *** Well nourished, well developed in no acute distress HEENT: Normal NECK: No JVD; No carotid bruits LYMPHATICS: No lymphadenopathy CARDIAC: ***RRR, no murmurs, rubs, gallops RESPIRATORY:  Clear to auscultation without rales, wheezing or rhonchi  ABDOMEN: Soft, non-tender, non-distended MUSCULOSKELETAL:  No edema; No deformity  SKIN: Warm and dry NEUROLOGIC:  Alert and oriented x 3 PSYCHIATRIC:  Normal affect   ASSESSMENT:    No diagnosis found. PLAN:     Hypotension:  RTC in***  Medication Adjustments/Labs and Tests Ordered: Current medicines are reviewed at length with the patient today.  Concerns regarding medicines are outlined above.  No orders of the defined types were placed in this encounter.  No orders of the defined types were placed in this encounter.   There are no Patient Instructions on file for this visit.   Signed, Little Ishikawa, MD  10/21/2021 5:50 PM    [facility]

## 2021-10-25 ENCOUNTER — Ambulatory Visit: Payer: Medicare HMO | Admitting: Cardiology

## 2021-10-28 DIAGNOSIS — G894 Chronic pain syndrome: Secondary | ICD-10-CM | POA: Diagnosis not present

## 2021-10-28 DIAGNOSIS — M1991 Primary osteoarthritis, unspecified site: Secondary | ICD-10-CM | POA: Diagnosis not present

## 2021-11-28 DIAGNOSIS — G894 Chronic pain syndrome: Secondary | ICD-10-CM | POA: Diagnosis not present

## 2021-11-28 DIAGNOSIS — R232 Flushing: Secondary | ICD-10-CM | POA: Diagnosis not present

## 2022-01-01 DIAGNOSIS — E782 Mixed hyperlipidemia: Secondary | ICD-10-CM | POA: Diagnosis not present

## 2022-01-01 DIAGNOSIS — Z1331 Encounter for screening for depression: Secondary | ICD-10-CM | POA: Diagnosis not present

## 2022-01-01 DIAGNOSIS — Z6821 Body mass index (BMI) 21.0-21.9, adult: Secondary | ICD-10-CM | POA: Diagnosis not present

## 2022-01-01 DIAGNOSIS — M1991 Primary osteoarthritis, unspecified site: Secondary | ICD-10-CM | POA: Diagnosis not present

## 2022-01-01 DIAGNOSIS — M25561 Pain in right knee: Secondary | ICD-10-CM | POA: Diagnosis not present

## 2022-01-01 DIAGNOSIS — Z0001 Encounter for general adult medical examination with abnormal findings: Secondary | ICD-10-CM | POA: Diagnosis not present

## 2022-01-01 DIAGNOSIS — G894 Chronic pain syndrome: Secondary | ICD-10-CM | POA: Diagnosis not present

## 2022-01-13 DIAGNOSIS — M1991 Primary osteoarthritis, unspecified site: Secondary | ICD-10-CM | POA: Diagnosis not present

## 2022-01-13 DIAGNOSIS — G894 Chronic pain syndrome: Secondary | ICD-10-CM | POA: Diagnosis not present

## 2022-01-13 DIAGNOSIS — Z6821 Body mass index (BMI) 21.0-21.9, adult: Secondary | ICD-10-CM | POA: Diagnosis not present

## 2022-01-13 DIAGNOSIS — M19019 Primary osteoarthritis, unspecified shoulder: Secondary | ICD-10-CM | POA: Diagnosis not present

## 2022-03-03 DIAGNOSIS — M1991 Primary osteoarthritis, unspecified site: Secondary | ICD-10-CM | POA: Diagnosis not present

## 2022-03-03 DIAGNOSIS — G894 Chronic pain syndrome: Secondary | ICD-10-CM | POA: Diagnosis not present

## 2022-03-03 DIAGNOSIS — M19019 Primary osteoarthritis, unspecified shoulder: Secondary | ICD-10-CM | POA: Diagnosis not present

## 2022-04-02 ENCOUNTER — Ambulatory Visit (HOSPITAL_COMMUNITY)
Admission: RE | Admit: 2022-04-02 | Discharge: 2022-04-02 | Disposition: A | Discharge status: Home / Self Care | Payer: Medicare HMO | Source: Ambulatory Visit | Attending: Internal Medicine | Admitting: Internal Medicine

## 2022-04-02 ENCOUNTER — Other Ambulatory Visit (HOSPITAL_COMMUNITY): Payer: Self-pay | Admitting: Internal Medicine

## 2022-04-02 DIAGNOSIS — M5416 Radiculopathy, lumbar region: Secondary | ICD-10-CM | POA: Insufficient documentation

## 2022-04-02 DIAGNOSIS — M545 Low back pain, unspecified: Secondary | ICD-10-CM | POA: Diagnosis not present

## 2022-04-02 DIAGNOSIS — M19019 Primary osteoarthritis, unspecified shoulder: Secondary | ICD-10-CM | POA: Diagnosis not present

## 2022-04-02 DIAGNOSIS — M5137 Other intervertebral disc degeneration, lumbosacral region: Secondary | ICD-10-CM | POA: Diagnosis not present

## 2022-04-02 DIAGNOSIS — Z6821 Body mass index (BMI) 21.0-21.9, adult: Secondary | ICD-10-CM | POA: Diagnosis not present

## 2022-04-02 DIAGNOSIS — G894 Chronic pain syndrome: Secondary | ICD-10-CM | POA: Diagnosis not present

## 2022-04-02 DIAGNOSIS — K219 Gastro-esophageal reflux disease without esophagitis: Secondary | ICD-10-CM | POA: Diagnosis not present

## 2022-04-02 DIAGNOSIS — G4709 Other insomnia: Secondary | ICD-10-CM | POA: Diagnosis not present

## 2022-04-02 DIAGNOSIS — J449 Chronic obstructive pulmonary disease, unspecified: Secondary | ICD-10-CM | POA: Diagnosis not present

## 2022-04-02 DIAGNOSIS — M549 Dorsalgia, unspecified: Secondary | ICD-10-CM | POA: Diagnosis not present

## 2022-04-02 DIAGNOSIS — F1120 Opioid dependence, uncomplicated: Secondary | ICD-10-CM | POA: Diagnosis not present

## 2022-04-03 ENCOUNTER — Other Ambulatory Visit: Payer: Self-pay | Admitting: Internal Medicine

## 2022-04-03 ENCOUNTER — Other Ambulatory Visit (HOSPITAL_COMMUNITY): Payer: Self-pay | Admitting: Internal Medicine

## 2022-04-03 DIAGNOSIS — M5416 Radiculopathy, lumbar region: Secondary | ICD-10-CM

## 2022-04-25 ENCOUNTER — Ambulatory Visit (HOSPITAL_COMMUNITY)
Admission: RE | Admit: 2022-04-25 | Discharge: 2022-04-25 | Disposition: A | Discharge status: Home / Self Care | Payer: Medicare HMO | Source: Ambulatory Visit | Attending: Internal Medicine | Admitting: Internal Medicine

## 2022-04-25 DIAGNOSIS — M5416 Radiculopathy, lumbar region: Secondary | ICD-10-CM | POA: Insufficient documentation

## 2022-04-25 DIAGNOSIS — M47816 Spondylosis without myelopathy or radiculopathy, lumbar region: Secondary | ICD-10-CM | POA: Diagnosis not present

## 2022-04-30 DIAGNOSIS — M5416 Radiculopathy, lumbar region: Secondary | ICD-10-CM | POA: Diagnosis not present

## 2022-04-30 DIAGNOSIS — M1991 Primary osteoarthritis, unspecified site: Secondary | ICD-10-CM | POA: Diagnosis not present

## 2022-04-30 DIAGNOSIS — J441 Chronic obstructive pulmonary disease with (acute) exacerbation: Secondary | ICD-10-CM | POA: Diagnosis not present

## 2022-04-30 DIAGNOSIS — G894 Chronic pain syndrome: Secondary | ICD-10-CM | POA: Diagnosis not present

## 2022-05-07 DIAGNOSIS — L237 Allergic contact dermatitis due to plants, except food: Secondary | ICD-10-CM | POA: Diagnosis not present

## 2022-05-07 DIAGNOSIS — Z6821 Body mass index (BMI) 21.0-21.9, adult: Secondary | ICD-10-CM | POA: Diagnosis not present

## 2022-05-14 DIAGNOSIS — Z0100 Encounter for examination of eyes and vision without abnormal findings: Secondary | ICD-10-CM | POA: Diagnosis not present

## 2022-05-14 DIAGNOSIS — H5200 Hypermetropia, unspecified eye: Secondary | ICD-10-CM | POA: Diagnosis not present

## 2022-05-15 ENCOUNTER — Other Ambulatory Visit: Payer: Self-pay | Admitting: Internal Medicine

## 2022-05-15 DIAGNOSIS — Z1231 Encounter for screening mammogram for malignant neoplasm of breast: Secondary | ICD-10-CM

## 2022-05-16 ENCOUNTER — Ambulatory Visit
Admission: RE | Admit: 2022-05-16 | Discharge: 2022-05-16 | Disposition: A | Discharge status: Home / Self Care | Payer: Medicare HMO | Source: Ambulatory Visit | Attending: Internal Medicine | Admitting: Internal Medicine

## 2022-05-16 DIAGNOSIS — Z1231 Encounter for screening mammogram for malignant neoplasm of breast: Secondary | ICD-10-CM

## 2022-05-30 DIAGNOSIS — M19019 Primary osteoarthritis, unspecified shoulder: Secondary | ICD-10-CM | POA: Diagnosis not present

## 2022-05-30 DIAGNOSIS — G894 Chronic pain syndrome: Secondary | ICD-10-CM | POA: Diagnosis not present

## 2022-05-30 DIAGNOSIS — M1991 Primary osteoarthritis, unspecified site: Secondary | ICD-10-CM | POA: Diagnosis not present

## 2022-05-30 DIAGNOSIS — M5416 Radiculopathy, lumbar region: Secondary | ICD-10-CM | POA: Diagnosis not present

## 2022-06-30 DIAGNOSIS — J449 Chronic obstructive pulmonary disease, unspecified: Secondary | ICD-10-CM | POA: Diagnosis not present

## 2022-06-30 DIAGNOSIS — G894 Chronic pain syndrome: Secondary | ICD-10-CM | POA: Diagnosis not present

## 2022-06-30 DIAGNOSIS — F1120 Opioid dependence, uncomplicated: Secondary | ICD-10-CM | POA: Diagnosis not present

## 2022-06-30 DIAGNOSIS — M19019 Primary osteoarthritis, unspecified shoulder: Secondary | ICD-10-CM | POA: Diagnosis not present

## 2022-06-30 DIAGNOSIS — Z6821 Body mass index (BMI) 21.0-21.9, adult: Secondary | ICD-10-CM | POA: Diagnosis not present

## 2022-07-25 DIAGNOSIS — J449 Chronic obstructive pulmonary disease, unspecified: Secondary | ICD-10-CM | POA: Diagnosis not present

## 2022-07-25 DIAGNOSIS — M5416 Radiculopathy, lumbar region: Secondary | ICD-10-CM | POA: Diagnosis not present

## 2022-07-25 DIAGNOSIS — G894 Chronic pain syndrome: Secondary | ICD-10-CM | POA: Diagnosis not present

## 2022-07-25 DIAGNOSIS — M19019 Primary osteoarthritis, unspecified shoulder: Secondary | ICD-10-CM | POA: Diagnosis not present

## 2022-08-22 DIAGNOSIS — M5416 Radiculopathy, lumbar region: Secondary | ICD-10-CM | POA: Diagnosis not present

## 2022-08-22 DIAGNOSIS — M19019 Primary osteoarthritis, unspecified shoulder: Secondary | ICD-10-CM | POA: Diagnosis not present

## 2022-08-22 DIAGNOSIS — G894 Chronic pain syndrome: Secondary | ICD-10-CM | POA: Diagnosis not present

## 2022-08-22 DIAGNOSIS — F1120 Opioid dependence, uncomplicated: Secondary | ICD-10-CM | POA: Diagnosis not present

## 2022-08-27 DIAGNOSIS — L538 Other specified erythematous conditions: Secondary | ICD-10-CM | POA: Diagnosis not present

## 2022-08-27 DIAGNOSIS — L298 Other pruritus: Secondary | ICD-10-CM | POA: Diagnosis not present

## 2022-08-27 DIAGNOSIS — L820 Inflamed seborrheic keratosis: Secondary | ICD-10-CM | POA: Diagnosis not present

## 2022-08-27 DIAGNOSIS — R208 Other disturbances of skin sensation: Secondary | ICD-10-CM | POA: Diagnosis not present

## 2022-10-23 DIAGNOSIS — Z1331 Encounter for screening for depression: Secondary | ICD-10-CM | POA: Diagnosis not present

## 2022-10-23 DIAGNOSIS — G894 Chronic pain syndrome: Secondary | ICD-10-CM | POA: Diagnosis not present

## 2022-10-23 DIAGNOSIS — J449 Chronic obstructive pulmonary disease, unspecified: Secondary | ICD-10-CM | POA: Diagnosis not present

## 2022-10-23 DIAGNOSIS — Z0001 Encounter for general adult medical examination with abnormal findings: Secondary | ICD-10-CM | POA: Diagnosis not present

## 2022-10-23 DIAGNOSIS — E119 Type 2 diabetes mellitus without complications: Secondary | ICD-10-CM | POA: Diagnosis not present

## 2022-10-23 DIAGNOSIS — M19019 Primary osteoarthritis, unspecified shoulder: Secondary | ICD-10-CM | POA: Diagnosis not present

## 2022-10-23 DIAGNOSIS — E782 Mixed hyperlipidemia: Secondary | ICD-10-CM | POA: Diagnosis not present

## 2022-10-23 DIAGNOSIS — F1120 Opioid dependence, uncomplicated: Secondary | ICD-10-CM | POA: Diagnosis not present

## 2022-10-23 DIAGNOSIS — Z6820 Body mass index (BMI) 20.0-20.9, adult: Secondary | ICD-10-CM | POA: Diagnosis not present

## 2022-10-23 DIAGNOSIS — E039 Hypothyroidism, unspecified: Secondary | ICD-10-CM | POA: Diagnosis not present

## 2022-11-14 DIAGNOSIS — F1120 Opioid dependence, uncomplicated: Secondary | ICD-10-CM | POA: Diagnosis not present

## 2022-11-14 DIAGNOSIS — J449 Chronic obstructive pulmonary disease, unspecified: Secondary | ICD-10-CM | POA: Diagnosis not present

## 2022-11-14 DIAGNOSIS — G894 Chronic pain syndrome: Secondary | ICD-10-CM | POA: Diagnosis not present

## 2022-11-14 DIAGNOSIS — M19019 Primary osteoarthritis, unspecified shoulder: Secondary | ICD-10-CM | POA: Diagnosis not present

## 2023-01-09 DIAGNOSIS — F1120 Opioid dependence, uncomplicated: Secondary | ICD-10-CM | POA: Diagnosis not present

## 2023-01-09 DIAGNOSIS — M19019 Primary osteoarthritis, unspecified shoulder: Secondary | ICD-10-CM | POA: Diagnosis not present

## 2023-01-09 DIAGNOSIS — M5416 Radiculopathy, lumbar region: Secondary | ICD-10-CM | POA: Diagnosis not present

## 2023-01-09 DIAGNOSIS — J449 Chronic obstructive pulmonary disease, unspecified: Secondary | ICD-10-CM | POA: Diagnosis not present

## 2023-01-09 DIAGNOSIS — Z681 Body mass index (BMI) 19 or less, adult: Secondary | ICD-10-CM | POA: Diagnosis not present

## 2023-01-09 DIAGNOSIS — M1991 Primary osteoarthritis, unspecified site: Secondary | ICD-10-CM | POA: Diagnosis not present

## 2023-01-09 DIAGNOSIS — G894 Chronic pain syndrome: Secondary | ICD-10-CM | POA: Diagnosis not present

## 2023-03-06 DIAGNOSIS — M19019 Primary osteoarthritis, unspecified shoulder: Secondary | ICD-10-CM | POA: Diagnosis not present

## 2023-03-06 DIAGNOSIS — G894 Chronic pain syndrome: Secondary | ICD-10-CM | POA: Diagnosis not present

## 2023-03-06 DIAGNOSIS — F1120 Opioid dependence, uncomplicated: Secondary | ICD-10-CM | POA: Diagnosis not present

## 2023-03-06 DIAGNOSIS — J449 Chronic obstructive pulmonary disease, unspecified: Secondary | ICD-10-CM | POA: Diagnosis not present

## 2023-04-06 ENCOUNTER — Other Ambulatory Visit: Payer: Self-pay | Admitting: Internal Medicine

## 2023-04-06 DIAGNOSIS — Z1231 Encounter for screening mammogram for malignant neoplasm of breast: Secondary | ICD-10-CM

## 2023-05-01 DIAGNOSIS — M1991 Primary osteoarthritis, unspecified site: Secondary | ICD-10-CM | POA: Diagnosis not present

## 2023-05-01 DIAGNOSIS — J449 Chronic obstructive pulmonary disease, unspecified: Secondary | ICD-10-CM | POA: Diagnosis not present

## 2023-05-01 DIAGNOSIS — F1120 Opioid dependence, uncomplicated: Secondary | ICD-10-CM | POA: Diagnosis not present

## 2023-05-01 DIAGNOSIS — Z681 Body mass index (BMI) 19 or less, adult: Secondary | ICD-10-CM | POA: Diagnosis not present

## 2023-05-01 DIAGNOSIS — M19019 Primary osteoarthritis, unspecified shoulder: Secondary | ICD-10-CM | POA: Diagnosis not present

## 2023-05-01 DIAGNOSIS — M5416 Radiculopathy, lumbar region: Secondary | ICD-10-CM | POA: Diagnosis not present

## 2023-05-01 DIAGNOSIS — G894 Chronic pain syndrome: Secondary | ICD-10-CM | POA: Diagnosis not present

## 2023-05-18 ENCOUNTER — Ambulatory Visit: Admission: RE | Admit: 2023-05-18 | Payer: Medicare HMO | Source: Ambulatory Visit

## 2023-05-18 DIAGNOSIS — Z1231 Encounter for screening mammogram for malignant neoplasm of breast: Secondary | ICD-10-CM | POA: Diagnosis not present

## 2023-05-28 DIAGNOSIS — L255 Unspecified contact dermatitis due to plants, except food: Secondary | ICD-10-CM | POA: Diagnosis not present

## 2023-05-28 DIAGNOSIS — M19019 Primary osteoarthritis, unspecified shoulder: Secondary | ICD-10-CM | POA: Diagnosis not present

## 2023-05-28 DIAGNOSIS — F1120 Opioid dependence, uncomplicated: Secondary | ICD-10-CM | POA: Diagnosis not present

## 2023-05-28 DIAGNOSIS — B880 Other acariasis: Secondary | ICD-10-CM | POA: Diagnosis not present

## 2023-05-28 DIAGNOSIS — G894 Chronic pain syndrome: Secondary | ICD-10-CM | POA: Diagnosis not present

## 2023-05-28 DIAGNOSIS — J449 Chronic obstructive pulmonary disease, unspecified: Secondary | ICD-10-CM | POA: Diagnosis not present

## 2023-06-26 DIAGNOSIS — M5416 Radiculopathy, lumbar region: Secondary | ICD-10-CM | POA: Diagnosis not present

## 2023-06-26 DIAGNOSIS — G894 Chronic pain syndrome: Secondary | ICD-10-CM | POA: Diagnosis not present

## 2023-06-26 DIAGNOSIS — B349 Viral infection, unspecified: Secondary | ICD-10-CM | POA: Diagnosis not present

## 2023-06-26 DIAGNOSIS — F1120 Opioid dependence, uncomplicated: Secondary | ICD-10-CM | POA: Diagnosis not present

## 2023-06-26 DIAGNOSIS — M19019 Primary osteoarthritis, unspecified shoulder: Secondary | ICD-10-CM | POA: Diagnosis not present

## 2023-06-26 DIAGNOSIS — J449 Chronic obstructive pulmonary disease, unspecified: Secondary | ICD-10-CM | POA: Diagnosis not present

## 2023-07-22 DIAGNOSIS — J449 Chronic obstructive pulmonary disease, unspecified: Secondary | ICD-10-CM | POA: Diagnosis not present

## 2023-07-22 DIAGNOSIS — M19019 Primary osteoarthritis, unspecified shoulder: Secondary | ICD-10-CM | POA: Diagnosis not present

## 2023-07-22 DIAGNOSIS — G894 Chronic pain syndrome: Secondary | ICD-10-CM | POA: Diagnosis not present

## 2023-07-22 DIAGNOSIS — F1120 Opioid dependence, uncomplicated: Secondary | ICD-10-CM | POA: Diagnosis not present

## 2023-08-27 DIAGNOSIS — H52223 Regular astigmatism, bilateral: Secondary | ICD-10-CM | POA: Diagnosis not present

## 2023-08-27 DIAGNOSIS — H5203 Hypermetropia, bilateral: Secondary | ICD-10-CM | POA: Diagnosis not present

## 2023-08-27 DIAGNOSIS — H524 Presbyopia: Secondary | ICD-10-CM | POA: Diagnosis not present

## 2023-09-29 DIAGNOSIS — J441 Chronic obstructive pulmonary disease with (acute) exacerbation: Secondary | ICD-10-CM | POA: Diagnosis not present

## 2023-09-29 DIAGNOSIS — F1120 Opioid dependence, uncomplicated: Secondary | ICD-10-CM | POA: Diagnosis not present

## 2023-09-29 DIAGNOSIS — G894 Chronic pain syndrome: Secondary | ICD-10-CM | POA: Diagnosis not present

## 2023-09-29 DIAGNOSIS — J209 Acute bronchitis, unspecified: Secondary | ICD-10-CM | POA: Diagnosis not present

## 2023-11-27 DIAGNOSIS — D518 Other vitamin B12 deficiency anemias: Secondary | ICD-10-CM | POA: Diagnosis not present

## 2023-11-27 DIAGNOSIS — E782 Mixed hyperlipidemia: Secondary | ICD-10-CM | POA: Diagnosis not present

## 2023-11-27 DIAGNOSIS — M1991 Primary osteoarthritis, unspecified site: Secondary | ICD-10-CM | POA: Diagnosis not present

## 2023-11-27 DIAGNOSIS — G9332 Myalgic encephalomyelitis/chronic fatigue syndrome: Secondary | ICD-10-CM | POA: Diagnosis not present

## 2023-11-27 DIAGNOSIS — G894 Chronic pain syndrome: Secondary | ICD-10-CM | POA: Diagnosis not present

## 2023-11-27 DIAGNOSIS — J449 Chronic obstructive pulmonary disease, unspecified: Secondary | ICD-10-CM | POA: Diagnosis not present

## 2023-11-27 DIAGNOSIS — M19019 Primary osteoarthritis, unspecified shoulder: Secondary | ICD-10-CM | POA: Diagnosis not present

## 2023-11-27 DIAGNOSIS — K219 Gastro-esophageal reflux disease without esophagitis: Secondary | ICD-10-CM | POA: Diagnosis not present

## 2023-11-27 DIAGNOSIS — M5416 Radiculopathy, lumbar region: Secondary | ICD-10-CM | POA: Diagnosis not present

## 2023-11-27 DIAGNOSIS — Z0001 Encounter for general adult medical examination with abnormal findings: Secondary | ICD-10-CM | POA: Diagnosis not present

## 2023-11-27 DIAGNOSIS — E559 Vitamin D deficiency, unspecified: Secondary | ICD-10-CM | POA: Diagnosis not present

## 2023-11-27 DIAGNOSIS — F419 Anxiety disorder, unspecified: Secondary | ICD-10-CM | POA: Diagnosis not present

## 2023-11-27 DIAGNOSIS — F1120 Opioid dependence, uncomplicated: Secondary | ICD-10-CM | POA: Diagnosis not present

## 2023-12-03 ENCOUNTER — Other Ambulatory Visit (HOSPITAL_COMMUNITY): Payer: Self-pay | Admitting: Internal Medicine

## 2023-12-03 ENCOUNTER — Ambulatory Visit (HOSPITAL_COMMUNITY)
Admission: RE | Admit: 2023-12-03 | Discharge: 2023-12-03 | Disposition: A | Discharge status: Home / Self Care | Payer: Medicare HMO | Source: Ambulatory Visit | Attending: Internal Medicine | Admitting: Internal Medicine

## 2023-12-03 DIAGNOSIS — Z90710 Acquired absence of both cervix and uterus: Secondary | ICD-10-CM | POA: Diagnosis not present

## 2023-12-03 DIAGNOSIS — R1030 Lower abdominal pain, unspecified: Secondary | ICD-10-CM | POA: Diagnosis not present

## 2023-12-03 DIAGNOSIS — D1771 Benign lipomatous neoplasm of kidney: Secondary | ICD-10-CM | POA: Diagnosis not present

## 2023-12-03 DIAGNOSIS — J449 Chronic obstructive pulmonary disease, unspecified: Secondary | ICD-10-CM | POA: Diagnosis not present

## 2023-12-03 DIAGNOSIS — Z681 Body mass index (BMI) 19 or less, adult: Secondary | ICD-10-CM | POA: Diagnosis not present

## 2023-12-03 DIAGNOSIS — G894 Chronic pain syndrome: Secondary | ICD-10-CM | POA: Diagnosis not present

## 2023-12-03 DIAGNOSIS — F1120 Opioid dependence, uncomplicated: Secondary | ICD-10-CM | POA: Diagnosis not present

## 2023-12-03 DIAGNOSIS — M19019 Primary osteoarthritis, unspecified shoulder: Secondary | ICD-10-CM | POA: Diagnosis not present

## 2023-12-03 MED ORDER — IOHEXOL 9 MG/ML PO SOLN
500.0000 mL | ORAL | Status: AC
  Administered 2023-12-03: 500 mL via ORAL

## 2023-12-03 MED ORDER — IOHEXOL 300 MG/ML  SOLN
100.0000 mL | Freq: Once | INTRAMUSCULAR | Status: AC | PRN
  Administered 2023-12-03: 100 mL via INTRAVENOUS

## 2023-12-03 MED ORDER — IOHEXOL 9 MG/ML PO SOLN
ORAL | Status: AC
  Filled 2023-12-03: qty 1000

## 2024-01-05 DIAGNOSIS — Z1211 Encounter for screening for malignant neoplasm of colon: Secondary | ICD-10-CM | POA: Diagnosis not present

## 2024-01-05 DIAGNOSIS — Z1212 Encounter for screening for malignant neoplasm of rectum: Secondary | ICD-10-CM | POA: Diagnosis not present

## 2024-01-25 DIAGNOSIS — G894 Chronic pain syndrome: Secondary | ICD-10-CM | POA: Diagnosis not present

## 2024-01-25 DIAGNOSIS — J441 Chronic obstructive pulmonary disease with (acute) exacerbation: Secondary | ICD-10-CM | POA: Diagnosis not present

## 2024-01-25 DIAGNOSIS — F1120 Opioid dependence, uncomplicated: Secondary | ICD-10-CM | POA: Diagnosis not present

## 2024-01-25 DIAGNOSIS — J449 Chronic obstructive pulmonary disease, unspecified: Secondary | ICD-10-CM | POA: Diagnosis not present

## 2024-01-25 DIAGNOSIS — M5416 Radiculopathy, lumbar region: Secondary | ICD-10-CM | POA: Diagnosis not present

## 2024-01-25 DIAGNOSIS — M19019 Primary osteoarthritis, unspecified shoulder: Secondary | ICD-10-CM | POA: Diagnosis not present

## 2024-01-25 DIAGNOSIS — U071 COVID-19: Secondary | ICD-10-CM | POA: Diagnosis not present

## 2024-01-28 ENCOUNTER — Other Ambulatory Visit (HOSPITAL_COMMUNITY): Payer: Self-pay | Admitting: Internal Medicine

## 2024-01-28 ENCOUNTER — Ambulatory Visit: Admitting: Gastroenterology

## 2024-01-28 ENCOUNTER — Ambulatory Visit (HOSPITAL_COMMUNITY)
Admission: RE | Admit: 2024-01-28 | Discharge: 2024-01-28 | Disposition: A | Discharge status: Home / Self Care | Source: Ambulatory Visit | Attending: Internal Medicine | Admitting: Internal Medicine

## 2024-01-28 DIAGNOSIS — R42 Dizziness and giddiness: Secondary | ICD-10-CM | POA: Insufficient documentation

## 2024-04-14 DIAGNOSIS — G894 Chronic pain syndrome: Secondary | ICD-10-CM | POA: Diagnosis not present

## 2024-04-14 DIAGNOSIS — F1120 Opioid dependence, uncomplicated: Secondary | ICD-10-CM | POA: Diagnosis not present

## 2024-04-14 DIAGNOSIS — Z681 Body mass index (BMI) 19 or less, adult: Secondary | ICD-10-CM | POA: Diagnosis not present

## 2024-04-14 DIAGNOSIS — M5416 Radiculopathy, lumbar region: Secondary | ICD-10-CM | POA: Diagnosis not present

## 2024-04-14 DIAGNOSIS — M47816 Spondylosis without myelopathy or radiculopathy, lumbar region: Secondary | ICD-10-CM | POA: Diagnosis not present

## 2024-04-14 DIAGNOSIS — M19019 Primary osteoarthritis, unspecified shoulder: Secondary | ICD-10-CM | POA: Diagnosis not present

## 2024-06-23 DIAGNOSIS — J449 Chronic obstructive pulmonary disease, unspecified: Secondary | ICD-10-CM | POA: Diagnosis not present

## 2024-06-23 DIAGNOSIS — M47816 Spondylosis without myelopathy or radiculopathy, lumbar region: Secondary | ICD-10-CM | POA: Diagnosis not present

## 2024-06-23 DIAGNOSIS — M19019 Primary osteoarthritis, unspecified shoulder: Secondary | ICD-10-CM | POA: Diagnosis not present

## 2024-06-23 DIAGNOSIS — G894 Chronic pain syndrome: Secondary | ICD-10-CM | POA: Diagnosis not present
